# Patient Record
Sex: Male | Born: 1985 | Race: White | Hispanic: No | Marital: Single | Smoking: Former smoker
Health system: Southern US, Community
[De-identification: ages and names within clinical notes are randomized; demographics above are authoritative.]

## PROBLEM LIST (undated history)

## (undated) DIAGNOSIS — B20 Human immunodeficiency virus [HIV] disease: Secondary | ICD-10-CM

## (undated) DIAGNOSIS — I10 Essential (primary) hypertension: Secondary | ICD-10-CM

## (undated) HISTORY — DX: Human immunodeficiency virus (HIV) disease: B20

## (undated) HISTORY — DX: Essential (primary) hypertension: I10

## (undated) HISTORY — PX: APPENDECTOMY: SHX54

---

## 2014-09-12 ENCOUNTER — Encounter: Payer: Self-pay | Admitting: Internal Medicine

## 2014-09-12 ENCOUNTER — Ambulatory Visit (INDEPENDENT_AMBULATORY_CARE_PROVIDER_SITE_OTHER): Payer: 59 | Admitting: Internal Medicine

## 2014-09-12 VITALS — BP 150/100 | HR 104 | Temp 97.7°F | Resp 16 | Ht 65.0 in | Wt 192.8 lb

## 2014-09-12 DIAGNOSIS — B20 Human immunodeficiency virus [HIV] disease: Secondary | ICD-10-CM

## 2014-09-12 DIAGNOSIS — E669 Obesity, unspecified: Secondary | ICD-10-CM

## 2014-09-12 DIAGNOSIS — Z21 Asymptomatic human immunodeficiency virus [HIV] infection status: Secondary | ICD-10-CM | POA: Insufficient documentation

## 2014-09-12 DIAGNOSIS — I10 Essential (primary) hypertension: Secondary | ICD-10-CM | POA: Insufficient documentation

## 2014-09-12 DIAGNOSIS — E663 Overweight: Secondary | ICD-10-CM

## 2014-09-12 MED ORDER — TRAZODONE HCL 50 MG PO TABS: 25.0000 mg | ORAL_TABLET | Freq: Every evening | ORAL | Status: DC | PRN

## 2014-09-12 MED ORDER — METOPROLOL SUCCINATE ER 50 MG PO TB24: 50.0000 mg | ORAL_TABLET | Freq: Every day | ORAL | Status: DC

## 2014-09-12 MED ORDER — ELVITEG-COBIC-EMTRICIT-TENOFDF 150-150-200-300 MG PO TABS
1.0000 | ORAL_TABLET | Freq: Every day | ORAL | Status: DC
Start: 2014-09-12 — End: 2014-11-21

## 2014-09-12 MED ORDER — ELVITEG-COBIC-EMTRICIT-TENOFDF 150-150-200-300 MG PO TABS: 1.0000 | ORAL_TABLET | Freq: Every day | ORAL | Status: DC

## 2014-09-12 NOTE — Assessment & Plan Note (Signed)
Not well controlled on losartan/hytc.  Given his tachycardia,  Will try metoprolol  Starting at 50 mg daily.

## 2014-09-12 NOTE — Assessment & Plan Note (Signed)
I have addressed  BMI and recommended a low glycemic index diet utilizing smaller more frequent meals to increase metabolism.  I have also recommended that patient start exercising with a goal of 30 minutes of aerobic exercise a minimum of 5 days per week. Screening for lipid disorders, thyroid and diabetes to be done   

## 2014-09-12 NOTE — Assessment & Plan Note (Signed)
Managed with Stribild.  [place] [facility] [facility] [place] and needs ID followup.  .  CD4, VL, RPR PSA. CBC and CMET ordered. ID referral pending.  Immunizations reviewed and up to date.

## 2014-09-12 NOTE — Patient Instructions (Addendum)
For the insomnia,   We are going to start trazodone ,    Start with 25 mg one hour befroe bedtime.  Limit alcohol to pre and during dinner ,  None afterward.   Changing BP med to  Toprol   Once daily starting  at 50 mg   This is  my version of a  "Low GI"  Diet:  It will still lower your blood sugars and allow you to lose 4 to 8  lbs  per month if you follow it carefully.  Your goal with exercise is a minimum of 30 minutes of aerobic exercise 5 days per week (Walking does not count once it becomes easy!)     All of the foods can be found at grocery stores and in bulk at [employer].  The Atkins protein bars and shakes are available in more varieties at Target, WalMart and Lowe's Foods.     7 AM Breakfast:  Choose from the following:  Low carbohydrate Protein  Shakes (I recommend the EAS AdvantEdge "Carb Control" shakes ,  the low carb shakes by Atkins, or Premier Protein shakes,  All at [employer]   Arnold's "Sandwhich Thin"toasted  w/ peanut butter (no jelly: about 20 net carbs  "Bagel Thin" with cream cheese and salmon: about 20 carbs   a scrambled egg/bacon/cheese burrito made with Mission's "carb balance" whole wheat tortilla  (about 10 net carbs )  A slice of home made fritatta (egg based dish without a crust:  google it)    Avoid cereal and bananas, oatmeal and cream of wheat and grits. They are loaded with carbohydrates!   10 AM: high protein snack  Protein bar by Atkins (the snack size, under 200 cal, usually < 6 net carbs).    A stick of cheese:  Around 1 carb,  100 cal     Dannon Light n Fit AustriaGreek Yogurt  (80 cal, 8 carbs)  Other so called "protein bars" and Greek yogurts tend to be loaded with carbohydrates.  Remember, in food advertising, the word "energy" is synonymous for " carbohydrate."  Lunch:   A Sandwich using the bread choices listed, Can use any  Eggs,  lunchmeat, grilled meat or canned tuna), avocado, regular mayo/mustard  and cheese.  A Salad using blue cheese,  ranch,  Goddess or vinagrette,  No croutons or "confetti" and no "candied nuts" but regular nuts OK.   No pretzels or chips.  Pickles and miniature sweet peppers are a good low carb alternative that provide a "crunch"  The bread is the only source of carbohydrate in a sandwich and  can be decreased by trying some of these alternatives to traditional loaf bread  Joseph's makes a pita bread and a flat bread that are 50 cal and 4 net carbs available at BJs and WalMart.  This can be toasted to use with hummous as well  Toufayan makes a low carb flatbread that's 100 cal and 9 net carbs available at [employer] and [employer]  Mission makes 2 sizes of  Low carb whole wheat tortilla  (The large one is 210 cal and 6 net carbs)  Flat Out makes flatbreads that are low carb as well  Avoid "Low fat dressings, as well as Reyne DumasCatalina and [address] dressings They are loaded with sugar!   3 PM/ Mid day  Snack:  Consider  1 ounce of  almonds, walnuts, pistachios, pecans, peanuts,  Macadamia nuts or a nut medley.  Avoid "granola";  the dried cranberries and raisins are loaded with carbohydrates. Mixed nuts as long as there are no raisins,  cranberries or dried fruit.    Try the prosciutto/mozzarella cheese sticks by Fiorruci  In deli /backery section   High protein   To avoid overindulging in snacks: Try drinking a glass of unsweeted almond/coconut milk  Or a cup of coffee with your Atkins chocolate bar to keep you from having 3!!!   Pork rinds!  Yes Pork Rinds        6 PM  Dinner:     Meat/fowl/fish with a green salad, and either broccoli, cauliflower, green beans, spinach, brussel sprouts or  Lima beans. DO NOT BREAD THE PROTEIN!!      There is a low carb pasta by Dreamfield's that is acceptable and tastes great: only 5 digestible carbs/serving.( All grocery stores but BJs carry it )  Try Kai LevinsMichel Angelo's chicken piccata or chicken or eggplant parm over low carb pasta.(Lowes and BJs)   Clifton CustardAaron Sanchez's "Carnitas"  (pulled pork, no sauce,  0 carbs) or his beef pot roast to make a dinner burrito (at BJ's)  Pesto over low carb pasta (bj's sells a good quality pesto in the center refrigerated section of the deli   Try satueeing  Roosvelt HarpsBok Choy with mushroooms  Whole wheat pasta is still full of digestible carbs and  Not as low in glycemic index as Dreamfield's.   Brown rice is still rice,  So skip the rice and noodles if you eat Congohinese or New Zealandhai (or at least limit to 1/2 cup)  9 PM snack :   Breyer's "low carb" fudgsicle or  ice cream bar (Carb Smart line), or  Weight Watcher's ice cream bar , or another "no sugar added" ice cream;  a serving of fresh berries/cherries with whipped cream   Cheese or DANNON'S LlGHT N FIT GREEK YOGURT or the Oikos greek yogurt   8 ounces of Blue Diamond unsweetened almond/cococunut milk  Cheese and crackers (using WASA crackers,  They are low carb) or peanut butter on low carb crackers or pita bread     Avoid bananas, pineapple, grapes  and watermelon on a regular basis because they are high in sugar.  THINK OF THEM AS DESSERT  Remember that snack Substitutions should be less than 10 NET carbs per serving and meals should be < 25 net carbs. Remember that carbohydrates from fiber do not affect blood sugar, so you can  subtract fiber grams to get the "net carbs " of any particular food item.

## 2014-09-12 NOTE — Progress Notes (Signed)
Patient ID: Manuel Russell, male   DOB: 07-08-1986, 28 y.o.   MRN: [id number]   Patient Active Problem List   Diagnosis Date Noted  . Asymptomatic HIV infection 09/12/2014  . Essential hypertension 09/12/2014  . Obesity 09/12/2014    Subjective:  CC:   Chief Complaint  Patient presents with  . Establish Care    HPI:   Manuel Rudean HaskellChadwick Gentleis a 28 y.o. male who presents for establishment of primary care.  1) Asymptomatic HIV diagnosed in 2012, was notified by the Health Department, presumed to be from a prior homosexual contact.  Has not been sexualy active since.  Initial therapy was Complara, which he tolerated except for significant weight gain, was changed to Stribild 2 years ago.  Las VL was reportedly zero in May and  CD4 was 400 or 500  ( per patient).  No history of opportunistic infections,  Syphils, etc.   2) HTN:  Diagnosed in 2012.  Not controlled  Currently by home checks.  Prior trial of amlodipine , said it didn't work at the 5 or 10 mg dose.   3) Weight gainL ha sgained 50 lbs since age 28.  Not physically active.  Wants to lose weight.   No history of hospitlalizations      Past Medical History  Diagnosis Date  . Hypertension   . HIV infection    No Known Allergies    History reviewed. No pertinent past surgical history.  History   Social History  . Marital Status: Single    Spouse Name: N/A    Number of Children: N/A  . Years of Education: N/A   Occupational History  . 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 Smoking status: Former Smoker    Quit date: 09/02/2012  . Smokeless tobacco: Never Used  . Alcohol Use: 8.4 oz/week    0 Not specified, 14 Glasses of wine per week  . Drug Use: No  . Sexual Activity: No   Other Topics Concern  . Not on file   Social History Narrative   Family History  Problem Relation Age of Onset  . Hypertension Father   . Hyperlipidemia Father   . Heart disease Father   . Hyperlipidemia  Sister       Review of Systems:   The rest of the review of systems was negative except those addressed in the HPI.      Objective:  BP 150/100 mmHg  Pulse 104  Temp(Src) 97.7 F (36.5 C) (Oral)  Resp 16  Ht 5\' 5"  (1.651 m)  Wt 192 lb 12 oz (87.431 kg)  BMI 32.08 kg/m2  SpO2 96%  General appearance: alert, cooperative and appears stated age Ears: normal TM's and external ear canals both ears Throat: lips, mucosa, and tongue normal; teeth and gums normal Neck: no adenopathy, no carotid bruit, supple, symmetrical, trachea midline and thyroid not enlarged, symmetric, no tenderness/mass/nodules Back: symmetric, no curvature. ROM normal. No CVA tenderness. Lungs: clear to auscultation bilaterally Heart: regular rate and rhythm, S1, S2 normal, no murmur, click, rub or gallop Abdomen: soft, non-tender; bowel sounds normal; no masses,  no organomegaly Pulses: 2+ and symmetric Skin: Skin color, texture, turgor normal. No rashes or lesions Lymph nodes: Cervical, supraclavicular, and axillary nodes normal.  Assessment and Plan:  Asymptomatic HIV infection Managed with Stribild.  [place] [facility] [place] and needs ID followup.  .  CD4, VL, RPR PSA. CBC and CMET ordered. ID referral pending.  Immunizations reviewed and up to date.   Essential hypertension Not well controlled on losartan/hytc.  Given his tachycardia,  Will try metoprolol  Starting at 50 mg daily.   Obesity I have addressed  BMI and recommended a low glycemic index diet utilizing smaller more frequent meals to increase metabolism.  I have also recommended that patient start exercising with a goal of 30 minutes of aerobic exercise a minimum of 5 days per week. Screening for lipid disorders, thyroid and diabetes to be done     Updated Medication List Outpatient Encounter Prescriptions as of 09/12/2014  Medication Sig  . elvitegravir-cobicistat-emtricitabine-tenofovir (STRIBILD) 150-150-200-300 MG  TABS tablet Take 1 tablet by mouth daily with breakfast.  . lisinopril-hydrochlorothiazide (PRINZIDE,ZESTORETIC) 20-25 MG per tablet Take 1 tablet by mouth daily.  . metoprolol succinate (TOPROL-XL) 50 MG 24 hr tablet Take 1 tablet (50 mg total) by mouth daily. Take with or immediately following a meal.  . traZODone (DESYREL) 50 MG tablet Take 0.5-1 tablets (25-50 mg total) by mouth at bedtime as needed for sleep.  . [DISCONTINUED] elvitegravir-cobicistat-emtricitabine-tenofovir (STRIBILD) 150-150-200-300 MG TABS tablet Take 1 tablet by mouth daily with breakfast.  . [DISCONTINUED] elvitegravir-cobicistat-emtricitabine-tenofovir (STRIBILD) 150-150-200-300 MG TABS tablet Take 1 tablet by mouth daily with breakfast.     Orders Placed This Encounter  Procedures  . Comprehensive metabolic panel  . Hemoglobin A1c  . Lipid panel  . Microalbumin / creatinine urine ratio  . CBC with Differential  . TSH  . T-helper cells (CD4) count  . HIV-1 RNA ultraquant reflex to gentyp+  . RPR  . PSA  . Ambulatory referral to Infectious Disease    No Follow-up on file.

## 2014-09-15 ENCOUNTER — Observation Stay: Payer: Self-pay | Admitting: Surgery

## 2014-09-15 LAB — URINALYSIS, COMPLETE
Bacteria: NONE SEEN
Bilirubin,UR: NEGATIVE
Blood: NEGATIVE
Glucose,UR: NEGATIVE mg/dL (ref 0–75)
Leukocyte Esterase: NEGATIVE
Nitrite: NEGATIVE
PROTEIN: NEGATIVE
Ph: 5 (ref 4.5–8.0)
RBC,UR: 1 /HPF (ref 0–5)
Specific Gravity: 1.028 (ref 1.003–1.030)
Squamous Epithelial: NONE SEEN
WBC UR: 1 /HPF (ref 0–5)

## 2014-09-15 LAB — COMPREHENSIVE METABOLIC PANEL
ALT: 56 U/L
ANION GAP: 9 (ref 7–16)
Albumin: 3.7 g/dL (ref 3.4–5.0)
Alkaline Phosphatase: 110 U/L
BUN: 18 mg/dL (ref 7–18)
Bilirubin,Total: 0.7 mg/dL (ref 0.2–1.0)
CHLORIDE: 100 mmol/L (ref 98–107)
Calcium, Total: 8.3 mg/dL — ABNORMAL LOW (ref 8.5–10.1)
Co2: 26 mmol/L (ref 21–32)
Creatinine: 1.24 mg/dL (ref 0.60–1.30)
EGFR (African American): >60
EGFR (Non-African Amer.): >60
GLUCOSE: 92 mg/dL (ref 65–99)
Osmolality: 272 (ref 275–301)
POTASSIUM: 3.6 mmol/L (ref 3.5–5.1)
SGOT(AST): 28 U/L (ref 15–37)
SODIUM: 135 mmol/L — AB (ref 136–145)
TOTAL PROTEIN: 7.9 g/dL (ref 6.4–8.2)

## 2014-09-15 LAB — CBC
HCT: 44.1 % (ref 40.0–52.0)
HGB: 15.1 g/dL (ref 13.0–18.0)
MCH: 32.3 pg (ref 26.0–34.0)
MCHC: 34.4 g/dL (ref 32.0–36.0)
MCV: 94 fL (ref 80–100)
Platelet: 176 10*3/uL (ref 150–440)
RBC: 4.69 10*6/uL (ref 4.40–5.90)
RDW: 12.3 % (ref 11.5–14.5)
WBC: 10.1 10*3/uL (ref 3.8–10.6)

## 2014-09-15 LAB — LIPASE, BLOOD: Lipase: 52 U/L — ABNORMAL LOW (ref 73–393)

## 2014-09-18 ENCOUNTER — Telehealth: Payer: Self-pay | Admitting: Internal Medicine

## 2014-09-18 NOTE — Telephone Encounter (Signed)
emmi emailed °

## 2014-09-19 ENCOUNTER — Other Ambulatory Visit: Payer: 59

## 2014-09-20 ENCOUNTER — Other Ambulatory Visit (INDEPENDENT_AMBULATORY_CARE_PROVIDER_SITE_OTHER): Payer: 59

## 2014-09-20 DIAGNOSIS — I10 Essential (primary) hypertension: Secondary | ICD-10-CM

## 2014-09-20 DIAGNOSIS — Z21 Asymptomatic human immunodeficiency virus [HIV] infection status: Secondary | ICD-10-CM

## 2014-09-20 DIAGNOSIS — E663 Overweight: Secondary | ICD-10-CM

## 2014-09-20 LAB — LIPID PANEL
CHOL/HDL RATIO: 5
Cholesterol: 192 mg/dL (ref 0–200)
HDL: 37.3 mg/dL — AB (ref >39.00)
NONHDL: 154.7
TRIGLYCERIDES: 274 mg/dL — AB (ref 0.0–149.0)
VLDL: 54.8 mg/dL — AB (ref 0.0–40.0)

## 2014-09-20 LAB — CBC WITH DIFFERENTIAL/PLATELET
BASOS ABS: 0 10*3/uL (ref 0.0–0.1)
Basophils Relative: 0.5 % (ref 0.0–3.0)
EOS ABS: 0.1 10*3/uL (ref 0.0–0.7)
EOS PCT: 2.2 % (ref 0.0–5.0)
HEMATOCRIT: 41.8 % (ref 39.0–52.0)
Hemoglobin: 14.1 g/dL (ref 13.0–17.0)
LYMPHS ABS: 1.7 10*3/uL (ref 0.7–4.0)
Lymphocytes Relative: 32.3 % (ref 12.0–46.0)
MCHC: 33.7 g/dL (ref 30.0–36.0)
MCV: 93 fl (ref 78.0–100.0)
Monocytes Absolute: 0.5 10*3/uL (ref 0.1–1.0)
Monocytes Relative: 9.6 % (ref 3.0–12.0)
Neutro Abs: 3 10*3/uL (ref 1.4–7.7)
Neutrophils Relative %: 55.4 % (ref 43.0–77.0)
PLATELETS: 227 10*3/uL (ref 150.0–400.0)
RBC: 4.5 Mil/uL (ref 4.22–5.81)
RDW: 12.4 % (ref 11.5–15.5)
WBC: 5.4 10*3/uL (ref 4.0–10.5)

## 2014-09-20 LAB — MICROALBUMIN / CREATININE URINE RATIO
Creatinine,U: 84.7 mg/dL
Microalb Creat Ratio: 0.2 mg/g (ref 0.0–30.0)
Microalb, Ur: 0.2 mg/dL (ref 0.0–1.9)

## 2014-09-20 LAB — COMPREHENSIVE METABOLIC PANEL
ALT: 64 U/L — AB (ref 0–53)
AST: 33 U/L (ref 0–37)
Albumin: 3.7 g/dL (ref 3.5–5.2)
Alkaline Phosphatase: 82 U/L (ref 39–117)
BILIRUBIN TOTAL: 0.5 mg/dL (ref 0.2–1.2)
BUN: 24 mg/dL — ABNORMAL HIGH (ref 6–23)
CO2: 25 mEq/L (ref 19–32)
Calcium: 8.9 mg/dL (ref 8.4–10.5)
Chloride: 106 mEq/L (ref 96–112)
Creatinine, Ser: 1.1 mg/dL (ref 0.4–1.5)
GFR: 82.25 mL/min (ref >60.00)
Glucose, Bld: 108 mg/dL — ABNORMAL HIGH (ref 70–99)
Potassium: 4 mEq/L (ref 3.5–5.1)
SODIUM: 138 meq/L (ref 135–145)
Total Protein: 6.7 g/dL (ref 6.0–8.3)

## 2014-09-20 LAB — HEMOGLOBIN A1C: Hgb A1c MFr Bld: 5.4 % (ref 4.6–6.5)

## 2014-09-20 LAB — TSH: TSH: 2.97 u[IU]/mL (ref 0.35–4.50)

## 2014-09-20 LAB — PSA: PSA: 0.58 ng/mL (ref 0.10–4.00)

## 2014-09-20 LAB — LDL CHOLESTEROL, DIRECT: LDL DIRECT: 126.3 mg/dL

## 2014-09-21 LAB — RPR

## 2014-09-23 ENCOUNTER — Other Ambulatory Visit (INDEPENDENT_AMBULATORY_CARE_PROVIDER_SITE_OTHER): Payer: 59

## 2014-09-23 DIAGNOSIS — Z21 Asymptomatic human immunodeficiency virus [HIV] infection status: Secondary | ICD-10-CM

## 2014-09-24 LAB — HIV-1 RNA ULTRAQUANT REFLEX TO GENTYP+

## 2014-09-24 LAB — T-HELPER CELLS (CD4) COUNT (NOT AT ARMC)
Absolute CD4: 508 /uL (ref 381–1469)
CD4 T Helper %: 22 % — ABNORMAL LOW (ref 32–62)
TOTAL LYMPHOCYTE COUNT: 2310 /uL (ref 700–3300)
Total Lymphocyte: 33 % (ref 12–46)
WBC, lymph enumeration: 7 10*3/uL (ref 4.0–10.5)

## 2014-10-14 ENCOUNTER — Telehealth: Payer: Self-pay

## 2014-10-14 NOTE — Telephone Encounter (Signed)
Message left for Dr Melina Schoolsullo's nurse:  Why was viral load with genotype cancelled?  Will patient be returning for re- draw. This lab is needed prior to office visit with physician. Please return call.    Laurell Josephsammy K King, RN

## 2014-11-05 ENCOUNTER — Ambulatory Visit: Payer: 59 | Admitting: Infectious Diseases

## 2014-11-05 ENCOUNTER — Encounter: Payer: Self-pay | Admitting: *Deleted

## 2014-11-21 ENCOUNTER — Ambulatory Visit (INDEPENDENT_AMBULATORY_CARE_PROVIDER_SITE_OTHER): Payer: Commercial Managed Care - PPO | Admitting: Infectious Disease

## 2014-11-21 ENCOUNTER — Encounter: Payer: Self-pay | Admitting: Infectious Disease

## 2014-11-21 VITALS — BP 149/92 | HR 77 | Temp 98.0°F | Ht 64.0 in | Wt 195.0 lb

## 2014-11-21 DIAGNOSIS — B20 Human immunodeficiency virus [HIV] disease: Secondary | ICD-10-CM

## 2014-11-21 DIAGNOSIS — I10 Essential (primary) hypertension: Secondary | ICD-10-CM

## 2014-11-21 DIAGNOSIS — E669 Obesity, unspecified: Secondary | ICD-10-CM

## 2014-11-21 DIAGNOSIS — G4700 Insomnia, unspecified: Secondary | ICD-10-CM

## 2014-11-21 MED ORDER — ELVITEG-COBIC-EMTRICIT-TENOFDF 150-150-200-300 MG PO TABS: 1.0000 | ORAL_TABLET | Freq: Every day | ORAL | Status: DC

## 2014-11-21 NOTE — Progress Notes (Signed)
   Subjective:    Patient ID: Manuel Russell, male    DOB: December 17, 1985, 28 y.o.   MRN: [id number]  HPI  29 year old diagnosed with HIV in 2012. He was contacted by DIS officer to come get tested. He had noticed onset of drenching night sweats 2 months earlier. CD4 nadir was 230. He was started on Complera for one year due to his gaining weight taking an extra meal with his Complera. He has comorbid hypertension, insomnia and hyperlipidemia.   Review of Systems  Constitutional: Negative for fever, chills, diaphoresis, activity change, appetite change, fatigue and unexpected weight change.  HENT: Negative for congestion, rhinorrhea, sinus pressure, sneezing, sore throat and trouble swallowing.   Eyes: Negative for photophobia and visual disturbance.  Respiratory: Negative for cough, chest tightness, shortness of breath, wheezing and stridor.   Cardiovascular: Negative for chest pain, palpitations and leg swelling.  Gastrointestinal: Negative for nausea, vomiting, abdominal pain, diarrhea, constipation, blood in stool, abdominal distention and anal bleeding.  Genitourinary: Negative for dysuria, hematuria, flank pain and difficulty urinating.  Musculoskeletal: Negative for myalgias, back pain, joint swelling, arthralgias and gait problem.  Skin: Negative for color change, pallor, rash and wound.  Neurological: Negative for dizziness, tremors, weakness and light-headedness.  Hematological: Negative for adenopathy. Does not bruise/bleed easily.  Psychiatric/Behavioral: Negative for behavioral problems, confusion, sleep disturbance, dysphoric mood, decreased concentration and agitation.       Objective:   Physical Exam  Constitutional: He is oriented to person, place, and time. He appears well-developed and well-nourished.  HENT:  Head: Normocephalic and atraumatic.  Eyes: Conjunctivae and EOM are normal.  Neck: Normal range of motion. Neck supple.  Cardiovascular: Normal rate and  regular rhythm.   Pulmonary/Chest: Effort normal. No respiratory distress. He has no wheezes.  Abdominal: Soft. He exhibits no distension.  Musculoskeletal: Normal range of motion.  Neurological: He is alert and oriented to person, place, and time.  Skin: Skin is warm and dry. No rash noted. No erythema. No pallor.  Psychiatric: He has a normal mood and affect. His behavior is normal. Thought content normal.          Assessment & Plan:   HIV disease: he has had perfect control int he past. The VL done in [place] was cancelled, will repeat and otherwise [facility]. I spent greater than 45 minutes with the patient including greater than 50% of time in face to face counsel of the patient and in coordination of their care.    HTN: managed by Dr. Darrick Huntsmanullo  Insomnia: continue atypical antidepressant  Obesity: can work with PCP on this as well

## 2014-11-22 LAB — HEPATITIS A ANTIBODY, TOTAL: Hep A Total Ab: REACTIVE — AB

## 2014-11-22 LAB — HEPATITIS B SURFACE ANTIBODY,QUALITATIVE: HEP B S AB: NEGATIVE

## 2014-11-25 LAB — HIV-1 RNA ULTRAQUANT REFLEX TO GENTYP+
HIV 1 RNA Quant: <20 copies/mL (ref <20)
HIV-1 RNA Quant, Log: <1.3 {Log} (ref <1.30)

## 2014-12-09 ENCOUNTER — Other Ambulatory Visit: Payer: Self-pay | Admitting: Infectious Diseases

## 2014-12-09 DIAGNOSIS — B20 Human immunodeficiency virus [HIV] disease: Secondary | ICD-10-CM

## 2014-12-23 ENCOUNTER — Other Ambulatory Visit: Payer: Self-pay | Admitting: *Deleted

## 2014-12-23 DIAGNOSIS — B20 Human immunodeficiency virus [HIV] disease: Secondary | ICD-10-CM

## 2014-12-23 MED ORDER — ELVITEG-COBIC-EMTRICIT-TENOFDF 150-150-200-300 MG PO TABS: 1.0000 | ORAL_TABLET | Freq: Every day | ORAL | Status: DC

## 2014-12-28 ENCOUNTER — Other Ambulatory Visit: Payer: Self-pay | Admitting: Infectious Disease

## 2014-12-28 DIAGNOSIS — B20 Human immunodeficiency virus [HIV] disease: Secondary | ICD-10-CM

## 2015-02-19 ENCOUNTER — Other Ambulatory Visit: Payer: Self-pay | Admitting: Internal Medicine

## 2015-02-19 NOTE — Telephone Encounter (Signed)
Left message for pt to return my call. Not on pt med list

## 2015-02-22 NOTE — H&P (Signed)
History of Present Illness 29 yom who was awoken at 4 AM this AM with RLQ pain. He has been nauseous and anorexic all day, but he has not vomited. His last meal was soup, ~ noon. No BM today. No fever.   Past History HIV positive   Past Med/Surgical Hx:  HIV:   HTN:   ALLERGIES:  No Known Allergies:   Family and Social History:  Family History Non-Contributory   Social History negative tobacco, positive ETOH, lives with male roommate (not his partner), homosexual, works as a Occupational psychologist, drinks two ETOH beverages (usually liquor) every day   Place of Living Home   Review of Systems:  Fever/Chills No   Cough No   Sputum No   Abdominal Pain Yes   Diarrhea No   Constipation No   Nausea/Vomiting Yes   SOB/DOE No   Chest Pain No   Dysuria No   Tolerating PT Yes   Tolerating Diet Yes  Nauseated   Medications/Allergies Reviewed Medications/Allergies reviewed   Physical Exam:  GEN well developed, well nourished, no acute distress   HEENT pink conjunctivae, PERRL, hearing intact to voice, moist oral mucosa, Oropharynx clear   NECK supple  trachea midline   RESP normal resp effort  clear BS  no use of accessory muscles   CARD regular rate  no murmur  no JVD  no Rub   ABD denies tenderness  soft, flat, c/o tenderness in RLQ but no rebound and no guarding   EXTR negative cyanosis/clubbing, negative edema   SKIN normal to palpation, No rashes, No ulcers, skin turgor good   NEURO cranial nerves intact, negative tremor, follows commands, motor/sensory function intact   PSYCH alert, A+O to time, place, person, good insight   Lab Results: Hepatic:  15-Nov-15 15:09   Bilirubin, Total 0.7  Alkaline Phosphatase 110 (46-116 NOTE: New Reference Range 05/21/14)  SGPT (ALT) 56 (14-63 NOTE: New Reference Range 05/21/14)  SGOT (AST) 28  Total Protein, Serum 7.9  Albumin, Serum 3.7  Routine Chem:  15-Nov-15 15:09   Glucose, Serum 92  BUN 18  Creatinine  (comp) 1.24  Sodium, Serum  135  Potassium, Serum 3.6  Chloride, Serum 100  CO2, Serum 26  Calcium (Total), Serum  8.3  Osmolality (calc) 272  eGFR (African American) >60  eGFR (Non-African American) >60 (eGFR values <66m/min/1.73 m2 may be an indication of chronic kidney disease (CKD). Calculated eGFR, using the MRDR Study equation, is useful in  patients with stable renal function. The eGFR calculation will not be reliable in acutely ill patients when serum creatinine is changing rapidly. It is not useful in patients on dialysis. The eGFR calculation may not be applicable to patients at the low and high extremes of body sizes, pregnant women, and vegetarians.)  Anion Gap 9  Lipase  52 (Result(s) reported on 15 Sep 2014 at 03:37PM.)  Routine UA:  15-Nov-15 15:10   Color (UA) Yellow  Clarity (UA) Clear  Glucose (UA) Negative  Bilirubin (UA) Negative  Ketones (UA) 1+  Specific Gravity (UA) 1.028  Blood (UA) Negative  pH (UA) 5.0  Protein (UA) Negative  Nitrite (UA) Negative  Leukocyte Esterase (UA) Negative (Result(s) reported on 15 Sep 2014 at 04:21PM.)  RBC (UA) 1 /HPF  WBC (UA) 1 /HPF  Bacteria (UA) NONE SEEN  Epithelial Cells (UA) NONE SEEN  Result(s) reported on 15 Sep 2014 at 04:21PM.  Routine Hem:  15-Nov-15 15:09   WBC (CBC) 10.1  RBC (CBC)  4.69  Hemoglobin (CBC) 15.1  Hematocrit (CBC) 44.1  Platelet Count (CBC) 176 (Result(s) reported on 15 Sep 2014 at 03:28PM.)  MCV 94  MCH 32.3  MCHC 34.4  RDW 12.3   Radiology Results: LabUnknown:    15-Nov-15 15:47, CT Abdomen and Pelvis With Contrast  PACS Image  CT:  CT Abdomen and Pelvis With Contrast  REASON FOR EXAM:    (1) RLQ pain, fever; (2) RLQ pain, fever  COMMENTS:       PROCEDURE: CT  - CT ABDOMEN / PELVIS  W  - Sep 15 2014  3:47PM     CLINICAL DATA:  29 year old male with right lower quadrant pain,  fever, nausea    EXAM:  CT ABDOMEN AND PELVIS WITH CONTRAST    TECHNIQUE:  Multidetector CT  imaging of the abdomen and pelvis was performed  using the standard protocol following bolus administration of  intravenous contrast.  CONTRAST:  100 mL Isovue 300 administered intravenously    COMPARISON:  None.    FINDINGS:  Lower Chest: The lung bases are clear. Visualized cardiac structures  are within normal limits for size. No pericardial effusion.  Unremarkable visualized distal thoracic esophagus.    Abdomen: Unremarkable CT appearance of the stomach, duodenum,  spleen, adrenal glands and pancreas. Diffuse mild hypoattenuation of  the hepatic parenchyma consistent with steatosis. There is mild  sparing around the gallbladder fossa. No discrete hepatic lesion.  Gallbladder is unremarkable. No intra or extrahepatic biliary ductal  dilatation. Unremarkable appearance of the bilateral kidneys. No  focal solid lesion, hydronephrosis or nephrolithiasis. 7 mm hypo  attenuating lesion in the lower pole of the left kidney is too small  for accurate characterization but is statistically highly likely a  benign cyst.    Focal hyper enhancement of the appendiceal mucosa. The appendix is  borderline dilated at 8 mm. There is inflammatory stranding in the  periappendiceal fat. No free fluid or free air to suggest  perforation. No focal abscess. No evidence of obstruction.    Pelvis: Unremarkable bladder, prostate gland and seminal vesicles.  No free fluid or suspicious adenopathy.    Bones/Soft Tissues: No acute fracture or aggressive appearing lytic  or blastic osseous lesion.  Vascular: No significant atherosclerotic vascular disease,  aneurysmal dilatation or acute abnormality.     IMPRESSION:  1. CT findings are consistent with acute suppurative appendicitis  without evidence of abscess or perforation.  2. Hepatic steatosis.  These results were called by telephone at the time of interpretation  on 09/15/2014 at 4:43 pm to Dr. Nance Pear , who verbally  acknowledged these  results.      Electronically Signed    By: Jacqulynn Cadet M.D.    On: 09/15/2014 16:45     Verified By: Criselda Peaches, M.D.,    Assessment/Admission Diagnosis Early acute appendicitis   Plan IV ABx and lap appy Pt understands that his surgery will be done by my partner, and agrees.   Electronic Signatures: Consuela Mimes (MD)  (Signed [phone] 17:22)  Authored: CHIEF COMPLAINT and HISTORY, PAST MEDICAL/SURGIAL HISTORY, ALLERGIES, FAMILY AND SOCIAL HISTORY, REVIEW OF SYSTEMS, PHYSICAL EXAM, LABS, Radiology, ASSESSMENT AND PLAN   Last Updated: 15-Nov-15 17:22 by Consuela Mimes (MD)

## 2015-02-22 NOTE — Op Note (Signed)
PATIENT NAME:  Manuel Russell, Manuel Russell MR#:  [id number] DATE OF BIRTH:  June 25, 1986  DATE OF PROCEDURE:  09/15/2014  PREOPERATIVE DIAGNOSIS:  Acute appendicitis.   POSTOPERATIVE DIAGNOSIS:  Acute appendicitis.   PROCEDURE PERFORMED:  Laparoscopic appendectomy.   SURGEON:  Zayyan Mullen A. Silvana Holecek, MD  ANESTHESIA:  General.   ESTIMATED BLOOD LOSS:  25 mL.   COMPLICATIONS:  None.   SPECIMEN:  Appendix.   INDICATION FOR SURGERY:  Manuel Russell is a pleasant 29 year old who presents with one day of right lower quadrant pain, mild leukocytosis, and CT scan findings concerning for acute appendicitis. He was thus brought to the operating room for laparoscopic appendectomy.   DETAILS OF THE PROCEDURE AS FOLLOWS:  Informed consent was obtained. Manuel Russell was brought to the operating room suite. He was induced. Endotracheal tube was placed. General anesthesia was administered. His abdomen was prepped and draped in standard surgical fashion. A timeout was then performed, correctly identifying patient name, operative site, and procedure to be performed. A supraumbilical incision was made and deepened down to the fascia. The fascia was incised. The peritoneum was entered. Two stay sutures were placed through the fasciotomy. A Hassan trocar was placed in the abdomen, and the abdomen was insufflated. A left lower quadrant 5 mm and suprapubic 5 mm trocar were placed under direct visualization. The appendix was visualized. It was inflamed to the sidewall of the abdomen. The base of the appendix was found. A hole was made in the mesoappendix at the base of the appendix. The appendix was ligated at the base of the cecum with a laparoscopic stapler with a blue load. I then used a combination of cautery and staple to transect the mesoappendix. The appendix was then brought out through an Endo Catch bag. The mesoappendix was then made hemostatic. The abdomen was then irrigated after hemostasis was obtained. All trocars were  removed under direct visualization. A figure-of-eight 0 Vicryl was used to close the fascia. The supraumbilical fascia and the skin sites were closed with 4-0 Monocryl deep dermal suture. Steri-Strips, Telfa gauze and Tegaderm were used to complete the dressing. The patient was then awoken, extubated, and brought to the postanesthesia care unit. There were no immediate complications. Needle, sponge, and instrument counts were correct at the end of the procedure.    ____________________________ Si Raiderhristopher A. Anastasya Jewell, MD cal:nb D: 09/15/2014 22:04:13 ET T: 09/15/2014 22:56:34 ET JOB#: [id number]  cc: Cristal Deerhristopher A. Deepika Decatur, MD, <Dictator> Jarvis NewcomerHRISTOPHER A Chayla Shands MD ELECTRONICALLY SIGNED 10/07/2014 20:06

## 2015-02-24 LAB — SURGICAL PATHOLOGY

## 2015-02-24 NOTE — Telephone Encounter (Signed)
Have attempted to call pt again, no answer.

## 2015-03-10 ENCOUNTER — Other Ambulatory Visit: Payer: Commercial Managed Care - PPO

## 2015-03-13 ENCOUNTER — Other Ambulatory Visit: Payer: Commercial Managed Care - PPO

## 2015-03-13 DIAGNOSIS — B20 Human immunodeficiency virus [HIV] disease: Secondary | ICD-10-CM

## 2015-03-13 LAB — CBC WITH DIFFERENTIAL/PLATELET
Basophils Absolute: 0 10*3/uL (ref 0.0–0.1)
Basophils Relative: 0 % (ref 0–1)
EOS PCT: 2 % (ref 0–5)
Eosinophils Absolute: 0.1 10*3/uL (ref 0.0–0.7)
HEMATOCRIT: 46.4 % (ref 39.0–52.0)
Hemoglobin: 16 g/dL (ref 13.0–17.0)
LYMPHS PCT: 34 % (ref 12–46)
Lymphs Abs: 2.3 10*3/uL (ref 0.7–4.0)
MCH: 31.3 pg (ref 26.0–34.0)
MCHC: 34.5 g/dL (ref 30.0–36.0)
MCV: 90.6 fL (ref 78.0–100.0)
MPV: 10.2 fL (ref 8.6–12.4)
Monocytes Absolute: 0.7 10*3/uL (ref 0.1–1.0)
Monocytes Relative: 11 % (ref 3–12)
Neutro Abs: 3.6 10*3/uL (ref 1.7–7.7)
Neutrophils Relative %: 53 % (ref 43–77)
PLATELETS: 182 10*3/uL (ref 150–400)
RBC: 5.12 MIL/uL (ref 4.22–5.81)
RDW: 13.9 % (ref 11.5–15.5)
WBC: 6.8 10*3/uL (ref 4.0–10.5)

## 2015-03-13 LAB — COMPLETE METABOLIC PANEL WITH GFR
ALBUMIN: 3.9 g/dL (ref 3.5–5.2)
ALT: 49 U/L (ref 0–53)
AST: 24 U/L (ref 0–37)
Alkaline Phosphatase: 94 U/L (ref 39–117)
BUN: 14 mg/dL (ref 6–23)
CO2: 27 meq/L (ref 19–32)
CREATININE: 1.05 mg/dL (ref 0.50–1.35)
Calcium: 9.1 mg/dL (ref 8.4–10.5)
Chloride: 102 mEq/L (ref 96–112)
GFR, Est Non African American: >89 mL/min
Glucose, Bld: 94 mg/dL (ref 70–99)
POTASSIUM: 4.3 meq/L (ref 3.5–5.3)
Sodium: 138 mEq/L (ref 135–145)
Total Bilirubin: 0.5 mg/dL (ref 0.2–1.2)
Total Protein: 6.9 g/dL (ref 6.0–8.3)

## 2015-03-13 LAB — LIPID PANEL
Cholesterol: 210 mg/dL — ABNORMAL HIGH (ref 0–200)
HDL: 53 mg/dL (ref >=40)
LDL Cholesterol: 136 mg/dL — ABNORMAL HIGH (ref 0–99)
Total CHOL/HDL Ratio: 4 Ratio
Triglycerides: 107 mg/dL (ref <150)
VLDL: 21 mg/dL (ref 0–40)

## 2015-03-13 LAB — HEPATITIS C ANTIBODY: HCV AB: NEGATIVE

## 2015-03-14 LAB — T-HELPER CELL (CD4) - (RCID CLINIC ONLY)
CD4 % Helper T Cell: 24 % — ABNORMAL LOW (ref 33–55)
CD4 T CELL ABS: 550 /uL (ref 400–2700)

## 2015-03-14 LAB — RPR

## 2015-03-15 LAB — HIV-1 RNA QUANT-NO REFLEX-BLD
HIV 1 RNA Quant: 24 copies/mL — ABNORMAL HIGH (ref <20)
HIV-1 RNA QUANT, LOG: 1.38 {Log} — AB (ref <1.30)

## 2015-03-24 ENCOUNTER — Ambulatory Visit (INDEPENDENT_AMBULATORY_CARE_PROVIDER_SITE_OTHER): Payer: BLUE CROSS/BLUE SHIELD | Admitting: Infectious Disease

## 2015-03-24 ENCOUNTER — Encounter: Payer: Self-pay | Admitting: Infectious Disease

## 2015-03-24 DIAGNOSIS — B20 Human immunodeficiency virus [HIV] disease: Secondary | ICD-10-CM | POA: Diagnosis not present

## 2015-03-24 MED ORDER — ELVITEG-COBIC-EMTRICIT-TENOFAF 150-150-200-10 MG PO TABS: 1.0000 | ORAL_TABLET | Freq: Every day | ORAL | Status: DC

## 2015-03-24 NOTE — Progress Notes (Signed)
   Subjective:    Patient ID: Manuel Russell, male    DOB: [birth year], 29 y.o.   MRN: [id number]  HPI   29 year old diagnosed with HIV in 2012. He was contacted by DIS officer in [place] to come get tested. He had noticed onset of drenching night sweats 2 months earlier. CD4 nadir was 230. He was started on Complera for one year due to his gaining weight taking an extra meal with his Complera. He has comorbid hypertension, insomnia and hyperlipidemia. He is now on STRIBILD with perfect virological suppression. We reviewed the fact that STRIBILD needs to be taken with food and it appears he has not always been so doing and I reiterated that it must be taken with SOME food, just not the caloric requirement of 400 calories that Complera requires.   Review of Systems  Constitutional: Negative for fever, chills, diaphoresis, activity change, appetite change, fatigue and unexpected weight change.  HENT: Negative for congestion, rhinorrhea, sinus pressure, sneezing, sore throat and trouble swallowing.   Eyes: Negative for photophobia and visual disturbance.  Respiratory: Negative for cough, chest tightness, shortness of breath, wheezing and stridor.   Cardiovascular: Negative for chest pain, palpitations and leg swelling.  Gastrointestinal: Negative for nausea, vomiting, abdominal pain, diarrhea, constipation, blood in stool, abdominal distention and anal bleeding.  Genitourinary: Negative for dysuria, hematuria, flank pain and difficulty urinating.  Musculoskeletal: Negative for myalgias, back pain, joint swelling, arthralgias and gait problem.  Skin: Negative for color change, pallor, rash and wound.  Neurological: Negative for dizziness, tremors, weakness and light-headedness.  Hematological: Negative for adenopathy. Does not bruise/bleed easily.  Psychiatric/Behavioral: Negative for behavioral problems, confusion, sleep disturbance, dysphoric mood, decreased concentration and agitation.       Objective:   Physical Exam  Constitutional: He is oriented to person, place, and time. He appears well-developed and well-nourished.  HENT:  Head: Normocephalic and atraumatic.  Eyes: Conjunctivae and EOM are normal.  Neck: Normal range of motion. Neck supple.  Cardiovascular: Normal rate and regular rhythm.   Pulmonary/Chest: Effort normal. No respiratory distress. He has no wheezes.  Abdominal: Soft. He exhibits no distension.  Musculoskeletal: Normal range of motion.  Neurological: He is alert and oriented to person, place, and time.  Skin: Skin is warm and dry. No rash noted. No erythema. No pallor.  Psychiatric: He has a normal mood and affect. His behavior is normal. Thought content normal.          Assessment & Plan:   HIV disease: perfect control, change to "new STRIBILD" = GENVOYA with food for better bone and kidney safety  HTN: managed by Dr. Darrick Huntsmanullo, BP up today  Insomnia: continue atypical antidepressant  Obesity: can work with PCP on this as well

## 2015-04-04 ENCOUNTER — Telehealth: Payer: Self-pay | Admitting: Internal Medicine

## 2015-04-04 DIAGNOSIS — I10 Essential (primary) hypertension: Secondary | ICD-10-CM

## 2015-04-04 MED ORDER — LOSARTAN POTASSIUM-HCTZ 50-12.5 MG PO TABS: 1.0000 | ORAL_TABLET | Freq: Every day | ORAL | Status: DC

## 2015-04-04 NOTE — Telephone Encounter (Signed)
Message sent

## 2015-04-04 NOTE — Assessment & Plan Note (Signed)
Elevated persistently,  Adding losartan/hct to toprol .  Recheck in one week

## 2015-04-16 ENCOUNTER — Other Ambulatory Visit: Payer: Self-pay | Admitting: Internal Medicine

## 2015-04-16 DIAGNOSIS — B86 Scabies: Secondary | ICD-10-CM

## 2015-04-16 MED ORDER — IVERMECTIN 3 MG PO TABS: 200.0000 ug/kg | ORAL_TABLET | Freq: Once | ORAL | Status: DC

## 2015-04-29 ENCOUNTER — Other Ambulatory Visit: Payer: Self-pay | Admitting: Internal Medicine

## 2015-05-21 ENCOUNTER — Other Ambulatory Visit: Payer: Self-pay

## 2015-05-21 MED ORDER — LOSARTAN POTASSIUM-HCTZ 50-12.5 MG PO TABS: 1.0000 | ORAL_TABLET | Freq: Every day | ORAL | Status: DC

## 2015-05-21 NOTE — Telephone Encounter (Signed)
Patient had medication last refilled on 6/28. For a 30 day supply.  Can we refill for 90 days? Please advise?n

## 2015-05-21 NOTE — Telephone Encounter (Signed)
90 day supply authorized and sent   

## 2015-06-11 ENCOUNTER — Other Ambulatory Visit: Payer: Self-pay | Admitting: Licensed Clinical Social Worker

## 2015-06-11 ENCOUNTER — Encounter: Payer: Self-pay | Admitting: Infectious Disease

## 2015-06-11 MED ORDER — ELVITEG-COBIC-EMTRICIT-TENOFAF 150-150-200-10 MG PO TABS: 1.0000 | ORAL_TABLET | Freq: Every day | ORAL | Status: DC

## 2015-06-23 ENCOUNTER — Telehealth: Payer: Self-pay | Admitting: *Deleted

## 2015-06-23 DIAGNOSIS — B20 Human immunodeficiency virus [HIV] disease: Secondary | ICD-10-CM

## 2015-06-23 NOTE — Telephone Encounter (Signed)
Unbelievable that genvoya still not covered. Should let Jill Alexanders know from [place]. He should resume the [facility]

## 2015-06-23 NOTE — Telephone Encounter (Signed)
PA not approved for Genvoya.  MD please advise about what HIV rx to refill for the pt.

## 2015-06-24 MED ORDER — ELVITEG-COBIC-EMTRICIT-TENOFDF 150-150-200-300 MG PO TABS: 1.0000 | ORAL_TABLET | Freq: Every day | ORAL | Status: DC

## 2015-06-24 NOTE — Addendum Note (Signed)
Addended by: Jennet Maduro D on: 06/24/2015 02:57 PM   Modules accepted: Orders, Medications

## 2015-07-29 NOTE — Telephone Encounter (Signed)
Stribild rx approved - sent out on 07/11/15 by [facility]

## 2015-07-29 NOTE — Telephone Encounter (Signed)
If they will only cover stribild and not genvoya which is pretty amazing this late then he should be on stribild

## 2015-08-09 IMAGING — CT CT ABD-PELV W/ CM
2 of 4 series · 16 of 46 positions shown, 18 images · IV contrast (isovue)
Comparison: None.

CLINICAL DATA: 28-year-old male with right lower quadrant pain,
fever, nausea

EXAM:
CT ABDOMEN AND PELVIS WITH CONTRAST
TECHNIQUE: Multidetector CT imaging of the abdomen and pelvis was performed
using the standard protocol following bolus administration of
intravenous contrast.
CONTRAST:  100 mL Isovue 300 administered intravenously

[Series 2: routine abd pel with · axial · 0.70mm/px · z∈[-470,-46]mm · 13 of 95 slices shown, 15 images]
[im 5/95  soft-tissue]
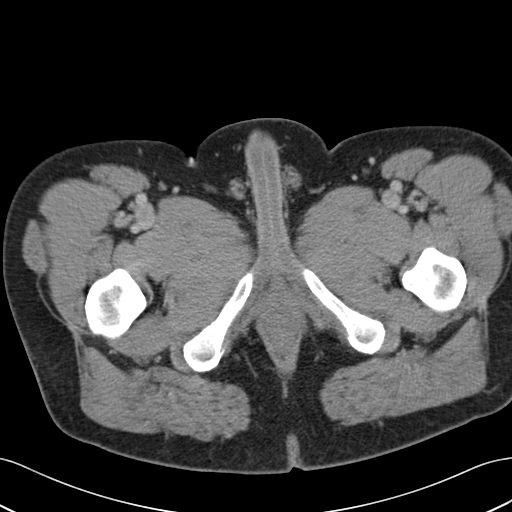
[im 5/95  bone]
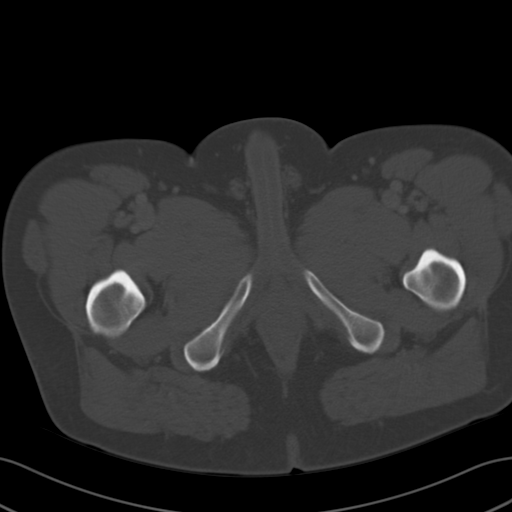
[im 13/95  soft-tissue]
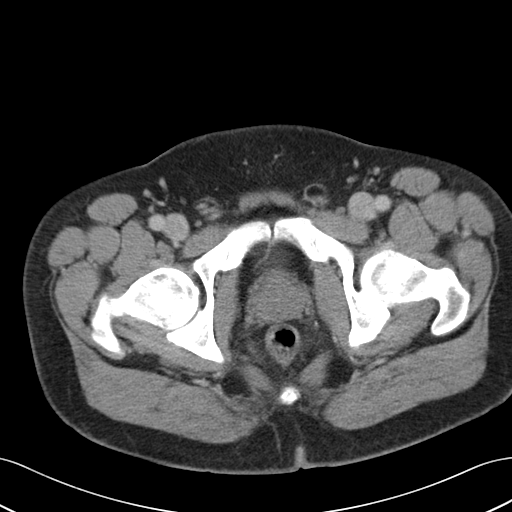
[im 21/95  soft-tissue]
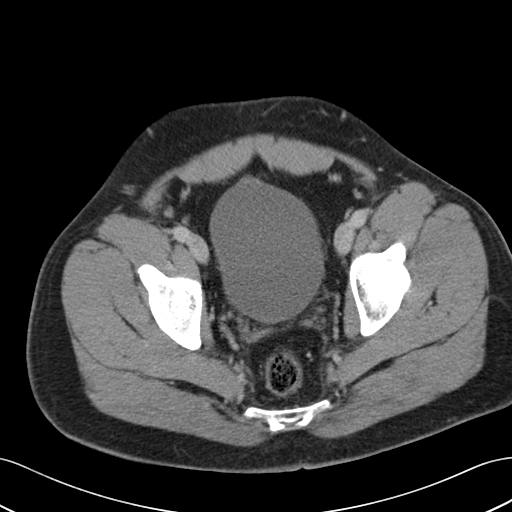
[im 25/95  soft-tissue]
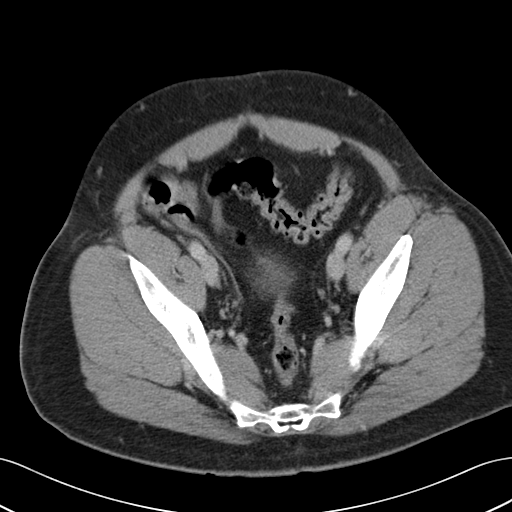
[im 33/95  soft-tissue]
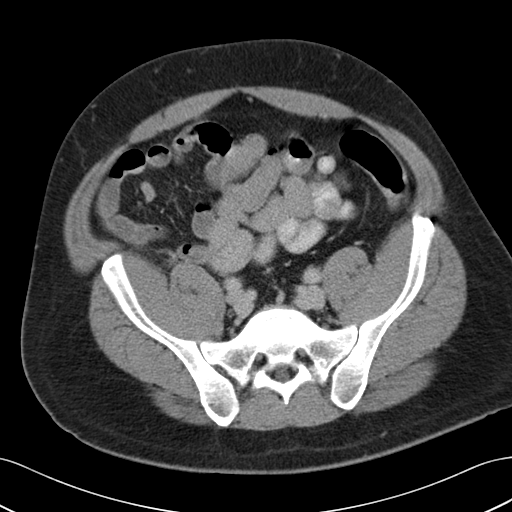
[im 41/95  soft-tissue]
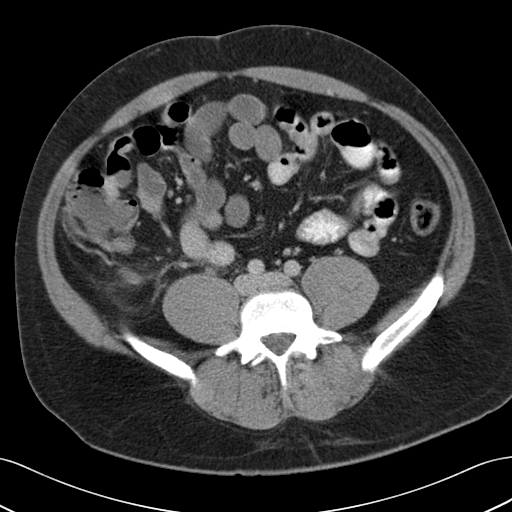
[im 50/95  soft-tissue]
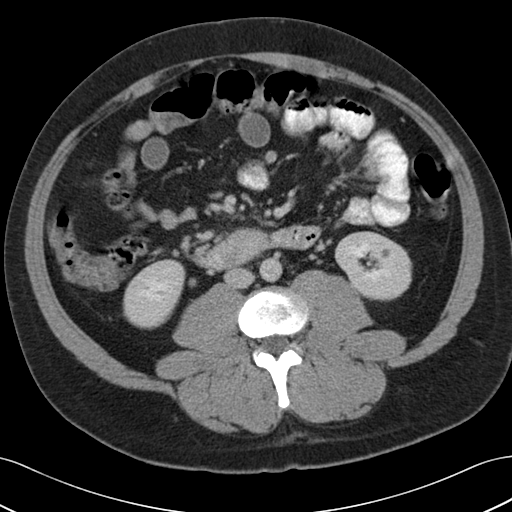
[im 54/95  soft-tissue]
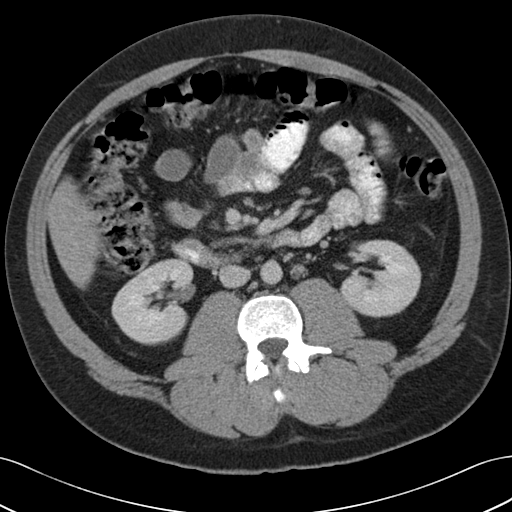
[im 62/95  soft-tissue]
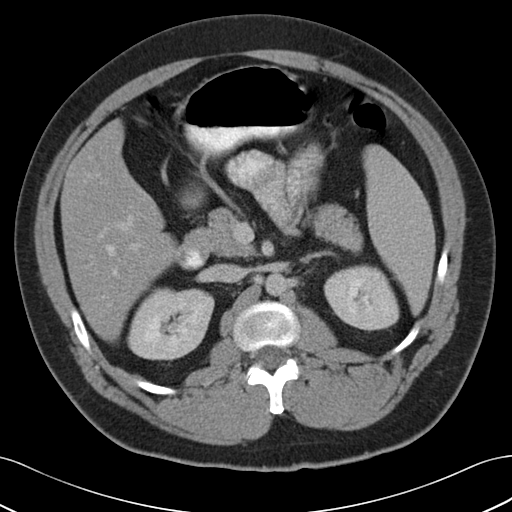
[im 62/95  bone]
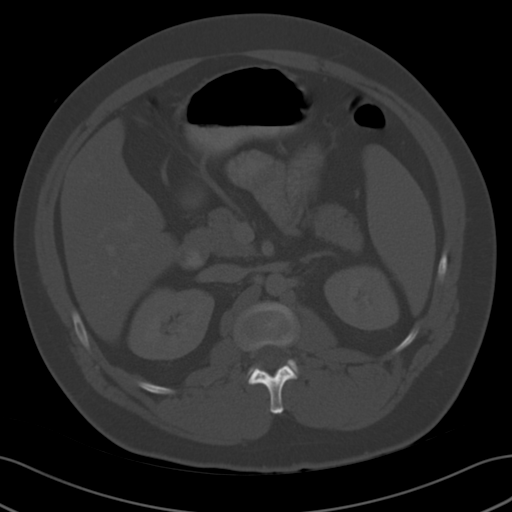
[im 70/95  soft-tissue]
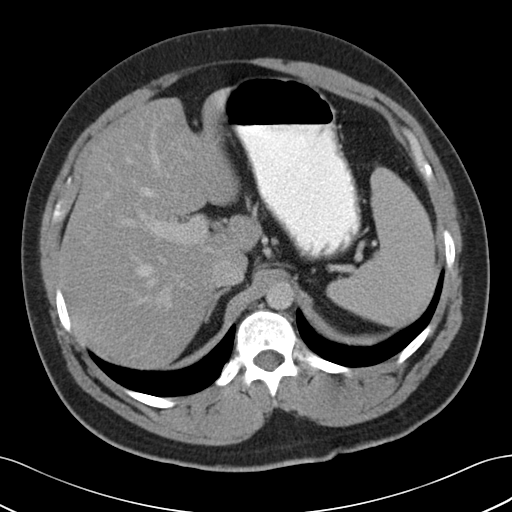
[im 74/95  soft-tissue]
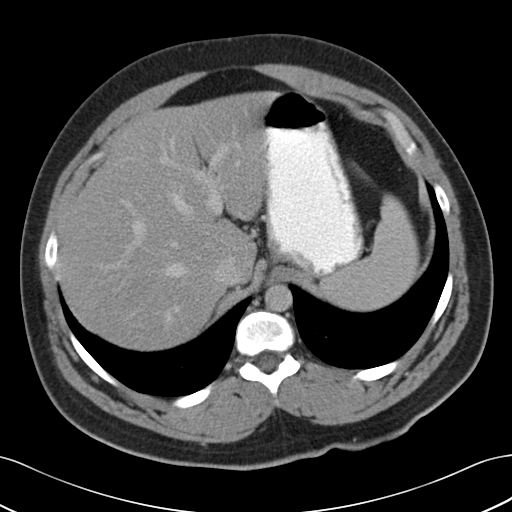
[im 82/95  soft-tissue]
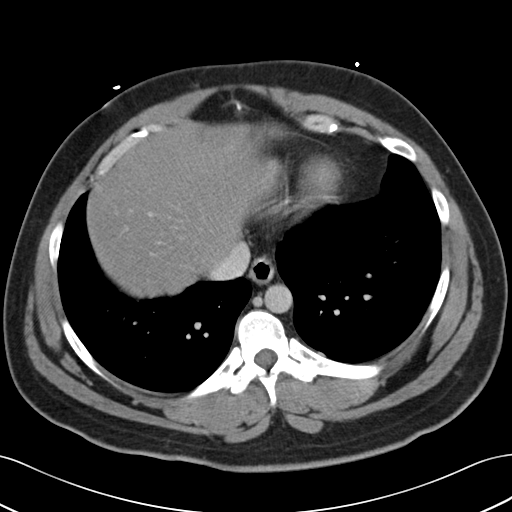
[im 90/95  soft-tissue]
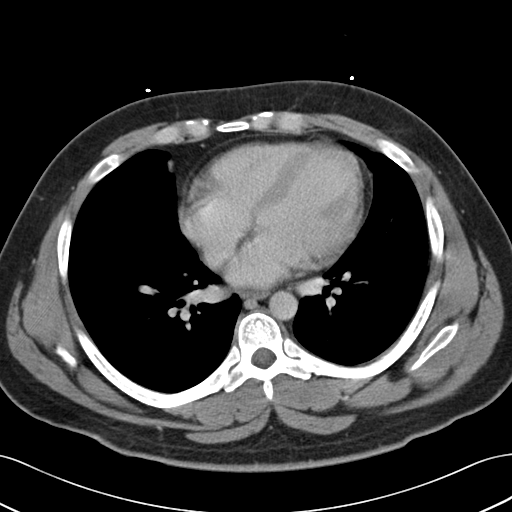

[Series 5: cor routine abd pel with · coronal · 0.71mm/px · 3 of 157 slices shown]
[im 53/157  soft-tissue]
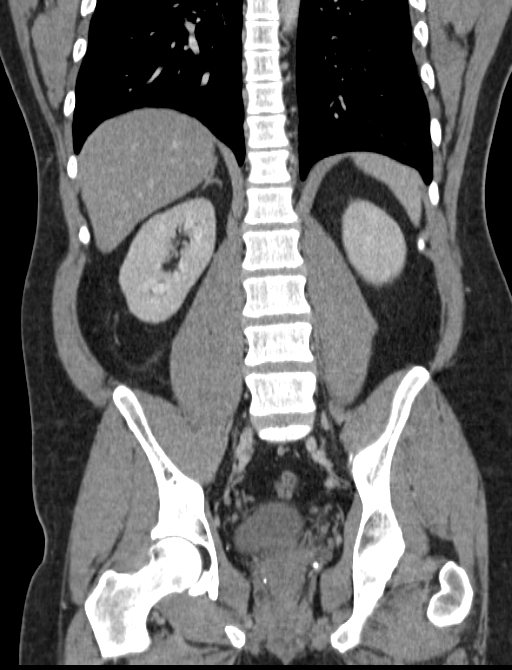
[im 70/157  soft-tissue]
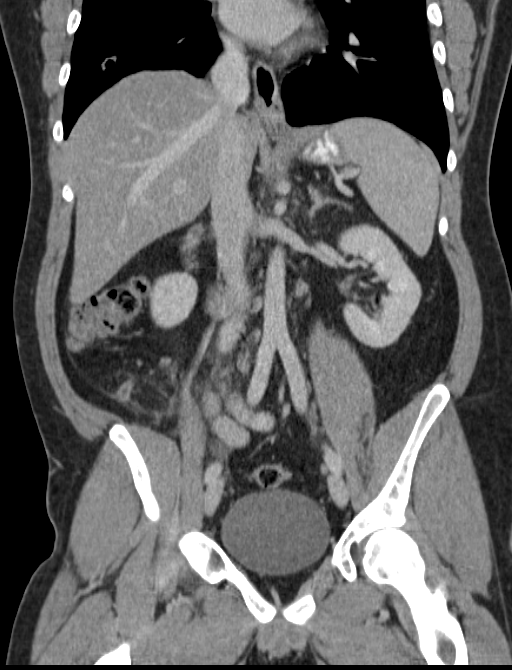
[im 87/157  soft-tissue]
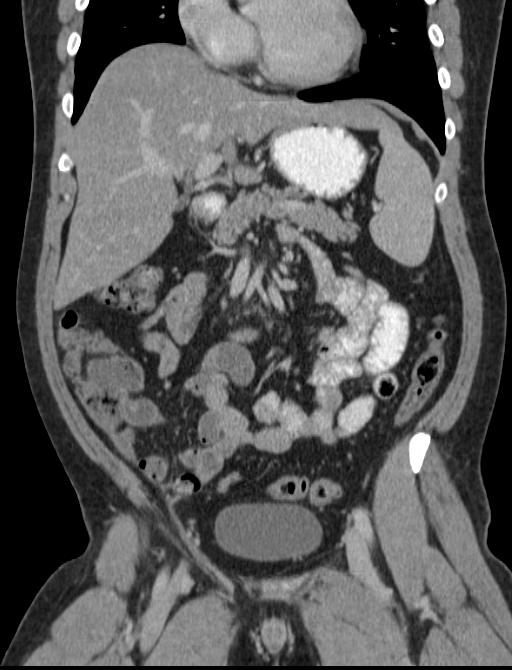

[16 of 46 positions shown; findings below may reference images not displayed]

FINDINGS: Lower Chest: The lung bases are clear. Visualized cardiac structures
are within normal limits for size. No pericardial effusion.
Unremarkable visualized distal thoracic esophagus.

Abdomen: Unremarkable CT appearance of the stomach, duodenum,
spleen, adrenal glands and pancreas. Diffuse mild hypoattenuation of
the hepatic parenchyma consistent with steatosis. There is mild
sparing around the gallbladder fossa. No discrete hepatic lesion.
Gallbladder is unremarkable. No intra or extrahepatic biliary ductal
dilatation. Unremarkable appearance of the bilateral kidneys. No
focal solid lesion, hydronephrosis or nephrolithiasis. 7 mm hypo
attenuating lesion in the lower pole of the left kidney is too small
for accurate characterization but is statistically highly likely a
benign cyst.

Focal hyper enhancement of the appendiceal mucosa. The appendix is
borderline dilated at 8 mm. There is inflammatory stranding in the
periappendiceal fat. No free fluid or free air to suggest
perforation. No focal abscess. No evidence of obstruction.

Pelvis: Unremarkable bladder, prostate gland and seminal vesicles.
No free fluid or suspicious adenopathy.

Bones/Soft Tissues: No acute fracture or aggressive appearing lytic
or blastic osseous lesion.

Vascular: No significant atherosclerotic vascular disease,
aneurysmal dilatation or acute abnormality.
IMPRESSION: 1. CT findings are consistent with acute suppurative appendicitis
without evidence of abscess or perforation.
2. Hepatic steatosis.
These results were called by telephone at the time of interpretation
on 09/15/2014 at [DATE] to Dr. UNO MAT , who verbally
acknowledged these results.

## 2015-09-12 ENCOUNTER — Encounter: Payer: Self-pay | Admitting: Infectious Disease

## 2015-09-13 ENCOUNTER — Other Ambulatory Visit: Payer: Self-pay | Admitting: Internal Medicine

## 2015-09-13 DIAGNOSIS — J020 Streptococcal pharyngitis: Secondary | ICD-10-CM | POA: Insufficient documentation

## 2015-09-13 MED ORDER — AMOXICILLIN-POT CLAVULANATE 875-125 MG PO TABS: 1.0000 | ORAL_TABLET | Freq: Two times a day (BID) | ORAL | Status: DC

## 2015-09-15 ENCOUNTER — Other Ambulatory Visit: Payer: Self-pay | Admitting: *Deleted

## 2015-09-15 DIAGNOSIS — B20 Human immunodeficiency virus [HIV] disease: Secondary | ICD-10-CM

## 2015-09-15 MED ORDER — ELVITEG-COBIC-EMTRICIT-TENOFAF 150-150-200-10 MG PO TABS: 1.0000 | ORAL_TABLET | Freq: Every day | ORAL | Status: DC

## 2015-09-15 NOTE — Telephone Encounter (Signed)
[facility] application. 

## 2015-10-22 ENCOUNTER — Other Ambulatory Visit (INDEPENDENT_AMBULATORY_CARE_PROVIDER_SITE_OTHER): Payer: Self-pay

## 2015-10-22 DIAGNOSIS — Z113 Encounter for screening for infections with a predominantly sexual mode of transmission: Secondary | ICD-10-CM

## 2015-10-22 DIAGNOSIS — B20 Human immunodeficiency virus [HIV] disease: Secondary | ICD-10-CM

## 2015-10-22 LAB — CBC WITH DIFFERENTIAL/PLATELET
BASOS ABS: 0.1 10*3/uL (ref 0.0–0.1)
Basophils Relative: 1 % (ref 0–1)
Eosinophils Absolute: 0.2 10*3/uL (ref 0.0–0.7)
Eosinophils Relative: 3 % (ref 0–5)
HEMATOCRIT: 45.3 % (ref 39.0–52.0)
HEMOGLOBIN: 15.5 g/dL (ref 13.0–17.0)
LYMPHS PCT: 39 % (ref 12–46)
Lymphs Abs: 2 10*3/uL (ref 0.7–4.0)
MCH: 31.1 pg (ref 26.0–34.0)
MCHC: 34.2 g/dL (ref 30.0–36.0)
MCV: 90.8 fL (ref 78.0–100.0)
MONO ABS: 0.5 10*3/uL (ref 0.1–1.0)
MPV: 10.1 fL (ref 8.6–12.4)
Monocytes Relative: 10 % (ref 3–12)
NEUTROS ABS: 2.4 10*3/uL (ref 1.7–7.7)
Neutrophils Relative %: 47 % (ref 43–77)
Platelets: 178 10*3/uL (ref 150–400)
RBC: 4.99 MIL/uL (ref 4.22–5.81)
RDW: 13.8 % (ref 11.5–15.5)
WBC: 5.2 10*3/uL (ref 4.0–10.5)

## 2015-10-22 LAB — COMPLETE METABOLIC PANEL WITH GFR
ALBUMIN: 4.3 g/dL (ref 3.6–5.1)
ALK PHOS: 75 U/L (ref 40–115)
ALT: 84 U/L — AB (ref 9–46)
AST: 48 U/L — AB (ref 10–40)
BILIRUBIN TOTAL: 0.4 mg/dL (ref 0.2–1.2)
BUN: 17 mg/dL (ref 7–25)
CALCIUM: 9.4 mg/dL (ref 8.6–10.3)
CO2: 26 mmol/L (ref 20–31)
CREATININE: 0.83 mg/dL (ref 0.60–1.35)
Chloride: 96 mmol/L — ABNORMAL LOW (ref 98–110)
GFR, Est African American: >89 mL/min (ref >=60)
GFR, Est Non African American: >89 mL/min (ref >=60)
Glucose, Bld: 92 mg/dL (ref 65–99)
Potassium: 4 mmol/L (ref 3.5–5.3)
Sodium: 134 mmol/L — ABNORMAL LOW (ref 135–146)
TOTAL PROTEIN: 7.8 g/dL (ref 6.1–8.1)

## 2015-10-22 LAB — LIPID PANEL
Cholesterol: 288 mg/dL — ABNORMAL HIGH (ref 125–200)
HDL: 36 mg/dL — AB (ref >=40)
LDL CALC: 179 mg/dL — AB (ref <130)
Total CHOL/HDL Ratio: 8 Ratio — ABNORMAL HIGH (ref <=5.0)
Triglycerides: 364 mg/dL — ABNORMAL HIGH (ref <150)
VLDL: 73 mg/dL — ABNORMAL HIGH (ref <30)

## 2015-10-23 LAB — RPR

## 2015-10-23 LAB — URINE CYTOLOGY ANCILLARY ONLY
CHLAMYDIA, DNA PROBE: NEGATIVE
NEISSERIA GONORRHEA: NEGATIVE

## 2015-10-23 LAB — T-HELPER CELL (CD4) - (RCID CLINIC ONLY)
CD4 T CELL HELPER: 24 % — AB (ref 33–55)
CD4 T Cell Abs: 470 /uL (ref 400–2700)

## 2015-10-24 LAB — HIV-1 RNA QUANT-NO REFLEX-BLD

## 2015-11-05 ENCOUNTER — Ambulatory Visit (INDEPENDENT_AMBULATORY_CARE_PROVIDER_SITE_OTHER): Payer: BLUE CROSS/BLUE SHIELD | Admitting: Infectious Disease

## 2015-11-05 ENCOUNTER — Encounter: Payer: Self-pay | Admitting: Infectious Disease

## 2015-11-05 VITALS — BP 149/97 | HR 96 | Temp 98.2°F | Ht 64.0 in | Wt 188.0 lb

## 2015-11-05 DIAGNOSIS — I10 Essential (primary) hypertension: Secondary | ICD-10-CM

## 2015-11-05 DIAGNOSIS — G4700 Insomnia, unspecified: Secondary | ICD-10-CM

## 2015-11-05 DIAGNOSIS — B20 Human immunodeficiency virus [HIV] disease: Secondary | ICD-10-CM

## 2015-11-05 NOTE — Progress Notes (Signed)
Chief complaint: followup for HIV Subjective:    Patient ID: Manuel Russell, male    DOB: [birth year], 30 y.o.   MRN: [id number]  HPI   30 year old diagnosed with HIV in 2012. He was contacted by DIS officer in [place] to come get tested. He had noticed onset of drenching night sweats 2 months earlier. CD4 nadir was 230. He was started on Complera for one year due to his gaining weight taking an extra meal with his Complera. He has comorbid hypertension, insomnia and hyperlipidemia. He has been doing superbly well on Genvoya.  Lab Results  Component Value Date   HIV1RNAQUANT <20 10/22/2015   Lab Results  Component Value Date   CD4TABS 470 10/22/2015   CD4TABS 550 03/13/2015   Past Medical History  Diagnosis Date  . Hypertension   . HIV infection [facility])     Past Surgical History  Procedure Laterality Date  . Appendectomy      Family History  Problem Relation Age of Onset  . Hypertension Father   . Hyperlipidemia Father   . Heart disease Father   . Hyperlipidemia Sister       Social History   Social History  . Marital Status: Single    Spouse Name: N/A  . Number of Children: N/A  . Years of Education: N/A   Occupational History  . 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 Smoking status: Former Smoker    Quit date: 09/02/2012  . Smokeless tobacco: Never Used  . Alcohol Use: 8.4 oz/week    14 Glasses of wine, 0 Standard drinks or equivalent per week  . Drug Use: No  . Sexual Activity: No   Other Topics Concern  . None   Social History Narrative    No Known Allergies   Current outpatient prescriptions:  .  elvitegravir-cobicistat-emtricitabine-tenofovir (GENVOYA) 150-150-200-10 MG TABS tablet, Take 1 tablet by mouth daily with breakfast., Disp: 30 tablet, Rfl: 5 .  losartan-hydrochlorothiazide (HYZAAR) 50-12.5 MG per tablet, Take 1 tablet by mouth daily., Disp: 90 tablet, Rfl: 1 .  metoprolol succinate (TOPROL-XL) 50 MG 24 hr tablet,  Take 1 tablet (50 mg total) by mouth daily. Take with or immediately following a meal., Disp: 90 tablet, Rfl: 3   Review of Systems  Constitutional: Negative for fever, chills, diaphoresis, activity change, appetite change, fatigue and unexpected weight change.  HENT: Negative for congestion, rhinorrhea, sinus pressure, sneezing, sore throat and trouble swallowing.   Eyes: Negative for photophobia and visual disturbance.  Respiratory: Negative for cough, chest tightness, shortness of breath, wheezing and stridor.   Cardiovascular: Negative for chest pain, palpitations and leg swelling.  Gastrointestinal: Negative for nausea, vomiting, abdominal pain, diarrhea, constipation, blood in stool, abdominal distention and anal bleeding.  Genitourinary: Negative for dysuria, hematuria, flank pain and difficulty urinating.  Musculoskeletal: Negative for myalgias, back pain, joint swelling, arthralgias and gait problem.  Skin: Negative for color change, pallor, rash and wound.  Neurological: Negative for dizziness, tremors, weakness and light-headedness.  Hematological: Negative for adenopathy. Does not bruise/bleed easily.  Psychiatric/Behavioral: Negative for behavioral problems, confusion, sleep disturbance, dysphoric mood, decreased concentration and agitation.       Objective:   Physical Exam  Constitutional: He is oriented to person, place, and time. He appears well-developed and well-nourished.  HENT:  Head: Normocephalic and atraumatic.  Eyes: Conjunctivae and EOM are normal.  Neck: Normal range of motion. Neck supple.  Cardiovascular: Normal rate and regular rhythm.   Pulmonary/Chest:  Effort normal. No respiratory distress. He has no wheezes.  Abdominal: Soft. He exhibits no distension.  Musculoskeletal: Normal range of motion.  Neurological: He is alert and oriented to person, place, and time.  Skin: Skin is warm and dry. No rash noted. No erythema. No pallor.  Psychiatric: He has a  normal mood and affect. His behavior is normal. Thought content normal.          Assessment & Plan:   HIV disease: perfect control on  GENVOYA with food for better bone and kidney safety, RTC in July in case he needs to renew ADAP. He lost insurance a few months ago but may be gaining insurance with new part time job  HTN:  BP up today claims to be taking his meds  Insomnia: continue atypical antidepressant  I spent greater than 25 minutes with the patient including greater than 50% of time in face to face counsel of the patient re his HIV, HTN, insomnia and in coordination of his care.

## 2015-11-21 ENCOUNTER — Other Ambulatory Visit: Payer: Self-pay | Admitting: Internal Medicine

## 2015-12-12 ENCOUNTER — Other Ambulatory Visit: Payer: Self-pay | Admitting: Internal Medicine

## 2015-12-12 MED ORDER — METOPROLOL SUCCINATE ER 50 MG PO TB24: 50.0000 mg | ORAL_TABLET | Freq: Every day | ORAL | Status: DC

## 2015-12-12 MED ORDER — LOSARTAN POTASSIUM-HCTZ 100-12.5 MG PO TABS: 1.0000 | ORAL_TABLET | Freq: Every day | ORAL | Status: DC

## 2015-12-12 MED ORDER — LOSARTAN POTASSIUM-HCTZ 50-12.5 MG PO TABS: 1.0000 | ORAL_TABLET | Freq: Every day | ORAL | Status: DC

## 2016-02-19 ENCOUNTER — Other Ambulatory Visit: Payer: Self-pay | Admitting: *Deleted

## 2016-02-19 ENCOUNTER — Encounter: Payer: Self-pay | Admitting: Infectious Disease

## 2016-02-19 DIAGNOSIS — B20 Human immunodeficiency virus [HIV] disease: Secondary | ICD-10-CM

## 2016-02-19 MED ORDER — ELVITEG-COBIC-EMTRICIT-TENOFAF 150-150-200-10 MG PO TABS: 1.0000 | ORAL_TABLET | Freq: Every day | ORAL | Status: DC

## 2016-05-05 ENCOUNTER — Other Ambulatory Visit: Payer: 59

## 2016-05-05 DIAGNOSIS — B20 Human immunodeficiency virus [HIV] disease: Secondary | ICD-10-CM

## 2016-05-05 LAB — CBC WITH DIFFERENTIAL/PLATELET
BASOS PCT: 0 %
Basophils Absolute: 0 cells/uL (ref 0–200)
EOS PCT: 3 %
Eosinophils Absolute: 183 cells/uL (ref 15–500)
HEMATOCRIT: 44 % (ref 38.5–50.0)
Hemoglobin: 15.1 g/dL (ref 13.2–17.1)
LYMPHS PCT: 38 %
Lymphs Abs: 2318 cells/uL (ref 850–3900)
MCH: 31.2 pg (ref 27.0–33.0)
MCHC: 34.3 g/dL (ref 32.0–36.0)
MCV: 90.9 fL (ref 80.0–100.0)
MONOS PCT: 9 %
MPV: 10.1 fL (ref 7.5–12.5)
Monocytes Absolute: 549 cells/uL (ref 200–950)
NEUTROS PCT: 50 %
Neutro Abs: 3050 cells/uL (ref 1500–7800)
PLATELETS: 223 10*3/uL (ref 140–400)
RBC: 4.84 MIL/uL (ref 4.20–5.80)
RDW: 13.5 % (ref 11.0–15.0)
WBC: 6.1 10*3/uL (ref 3.8–10.8)

## 2016-05-05 LAB — LIPID PANEL
CHOL/HDL RATIO: 3.9 ratio (ref <=5.0)
CHOLESTEROL: 224 mg/dL — AB (ref 125–200)
HDL: 58 mg/dL (ref >=40)
LDL Cholesterol: 125 mg/dL (ref <130)
Triglycerides: 206 mg/dL — ABNORMAL HIGH (ref <150)
VLDL: 41 mg/dL — AB (ref <30)

## 2016-05-05 LAB — COMPLETE METABOLIC PANEL WITH GFR
ALT: 31 U/L (ref 9–46)
AST: 18 U/L (ref 10–40)
Albumin: 4.2 g/dL (ref 3.6–5.1)
Alkaline Phosphatase: 69 U/L (ref 40–115)
BUN: 15 mg/dL (ref 7–25)
CALCIUM: 9 mg/dL (ref 8.6–10.3)
CHLORIDE: 102 mmol/L (ref 98–110)
CO2: 25 mmol/L (ref 20–31)
Creat: 0.97 mg/dL (ref 0.60–1.35)
GFR, Est African American: >89 mL/min (ref >=60)
GFR, Est Non African American: >89 mL/min (ref >=60)
Glucose, Bld: 94 mg/dL (ref 65–99)
POTASSIUM: 4.1 mmol/L (ref 3.5–5.3)
Sodium: 137 mmol/L (ref 135–146)
Total Bilirubin: 0.5 mg/dL (ref 0.2–1.2)
Total Protein: 7 g/dL (ref 6.1–8.1)

## 2016-05-06 LAB — T-HELPER CELL (CD4) - (RCID CLINIC ONLY)
CD4 T CELL ABS: 580 /uL (ref 400–2700)
CD4 T CELL HELPER: 24 % — AB (ref 33–55)

## 2016-05-06 LAB — URINE CYTOLOGY ANCILLARY ONLY
CHLAMYDIA, DNA PROBE: NEGATIVE
NEISSERIA GONORRHEA: NEGATIVE

## 2016-05-06 LAB — RPR

## 2016-05-07 LAB — HIV-1 RNA QUANT-NO REFLEX-BLD: HIV-1 RNA Quant, Log: <1.3 Log copies/mL (ref <1.30)

## 2016-05-26 ENCOUNTER — Ambulatory Visit: Payer: 59 | Admitting: Infectious Disease

## 2016-06-30 ENCOUNTER — Encounter: Payer: Self-pay | Admitting: Infectious Disease

## 2016-06-30 ENCOUNTER — Ambulatory Visit (INDEPENDENT_AMBULATORY_CARE_PROVIDER_SITE_OTHER): Payer: 59 | Admitting: Infectious Disease

## 2016-06-30 VITALS — BP 158/119 | HR 101 | Ht 64.0 in | Wt 196.2 lb

## 2016-06-30 DIAGNOSIS — G4700 Insomnia, unspecified: Secondary | ICD-10-CM

## 2016-06-30 DIAGNOSIS — E669 Obesity, unspecified: Secondary | ICD-10-CM

## 2016-06-30 DIAGNOSIS — I10 Essential (primary) hypertension: Secondary | ICD-10-CM | POA: Diagnosis not present

## 2016-06-30 DIAGNOSIS — Z23 Encounter for immunization: Secondary | ICD-10-CM

## 2016-06-30 DIAGNOSIS — B20 Human immunodeficiency virus [HIV] disease: Secondary | ICD-10-CM

## 2016-06-30 NOTE — Progress Notes (Signed)
Chief complaint: followup for HIV Subjective:    Patient ID: Manuel Russell, male    DOB: Jan 15, 1986, 30 y.o.   MRN: [id number]  HPI  30 year old Caucasian man with HIV well controlled on Genvoya with worsening HTN, part of which may be "white coat HTN"   Lab Results  Component Value Date   HIV1RNAQUANT <20 05/05/2016   HIV1RNAQUANT <20 10/22/2015   HIV1RNAQUANT 24 (H) 03/13/2015      Lab Results  Component Value Date   CD4TABS 580 05/05/2016   CD4TABS 470 10/22/2015   CD4TABS 550 03/13/2015   Past Medical History:  Diagnosis Date  . HIV infection (HCC)   . Hypertension     Past Surgical History:  Procedure Laterality Date  . APPENDECTOMY      Family History  Problem Relation Age of Onset  . Hypertension Father   . Hyperlipidemia Father   . Heart disease Father   . Hyperlipidemia Sister       Social History   Social History  . Marital status: Single    Spouse name: N/A  . Number of children: N/A  . 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 Smoking status: Former Smoker    Quit date: 09/02/2012  . Smokeless tobacco: Never Used  . Alcohol use 8.4 oz/week    14 Glasses of wine per week  . Drug use: No  . Sexual activity: No   Other Topics Concern  . Not on file   Social History Narrative  . No narrative on file    No Known Allergies   Current Outpatient Prescriptions:  .  elvitegravir-cobicistat-emtricitabine-tenofovir (GENVOYA) 150-150-200-10 MG TABS tablet, Take 1 tablet by mouth daily with breakfast., Disp: 30 tablet, Rfl: 5 .  losartan-hydrochlorothiazide (HYZAAR) 100-12.5 MG tablet, Take 1 tablet by mouth daily., Disp: 90 tablet, Rfl: 2   Review of Systems  Constitutional: Negative for activity change, appetite change, chills, diaphoresis, fatigue, fever and unexpected weight change.  HENT: Negative for congestion, rhinorrhea, sinus pressure, sneezing, sore throat and  trouble swallowing.   Eyes: Negative for photophobia and visual disturbance.  Respiratory: Negative for cough, chest tightness, shortness of breath, wheezing and stridor.   Cardiovascular: Negative for chest pain, palpitations and leg swelling.  Gastrointestinal: Negative for abdominal distention, abdominal pain, anal bleeding, blood in stool, constipation, diarrhea, nausea and vomiting.  Genitourinary: Negative for difficulty urinating, dysuria, flank pain and hematuria.  Musculoskeletal: Negative for arthralgias, back pain, gait problem, joint swelling and myalgias.  Skin: Negative for color change, pallor, rash and wound.  Neurological: Negative for dizziness, tremors, weakness and light-headedness.  Hematological: Negative for adenopathy. Does not bruise/bleed easily.  Psychiatric/Behavioral: Negative for agitation, behavioral problems, confusion, decreased concentration, dysphoric mood and sleep disturbance.       Objective:   Physical Exam  Constitutional: He is oriented to person, place, and time. He appears well-developed and well-nourished.  HENT:  Head: Normocephalic and atraumatic.  Eyes: Conjunctivae and EOM are normal.  Neck: Normal range of motion. Neck supple.  Cardiovascular: Normal rate and regular rhythm.   Pulmonary/Chest: Effort normal. No respiratory distress. He has no wheezes.  Abdominal: Soft. He exhibits no distension.  Musculoskeletal: Normal range of motion.  Neurological: He is alert and oriented to person, place, and time.  Skin: Skin is warm and dry. No rash noted. No erythema. No pallor.  Psychiatric: He has a normal mood and affect. His  behavior is normal. Thought content normal.          Assessment & Plan:   HIV disease: perfect control on  GENVOYA with food for better bone and kidney safety, Consider change to newer STR when and if  FDA approved and covered by insurance  HTN:  BP up today. I told him I think he needs another drug for this. He  can monitor BP at home if he likes to give more data to Dr Manuel Russell next month  Insomnia: continue atypical antidepressant  I spent greater than 25 minutes with the patient including greater than 50% of time in face to face counsel of the patient re his HIV, HTN, insomnia and in coordination of his care.

## 2016-07-21 ENCOUNTER — Ambulatory Visit (INDEPENDENT_AMBULATORY_CARE_PROVIDER_SITE_OTHER): Payer: 59 | Admitting: Internal Medicine

## 2016-07-21 ENCOUNTER — Encounter: Payer: Self-pay | Admitting: Internal Medicine

## 2016-07-21 VITALS — BP 122/78 | HR 100 | Temp 98.5°F | Ht 64.0 in | Wt 190.0 lb

## 2016-07-21 DIAGNOSIS — I10 Essential (primary) hypertension: Secondary | ICD-10-CM | POA: Diagnosis not present

## 2016-07-21 DIAGNOSIS — Z0000 Encounter for general adult medical examination without abnormal findings: Secondary | ICD-10-CM | POA: Diagnosis not present

## 2016-07-21 DIAGNOSIS — E669 Obesity, unspecified: Secondary | ICD-10-CM | POA: Diagnosis not present

## 2016-07-21 DIAGNOSIS — E559 Vitamin D deficiency, unspecified: Secondary | ICD-10-CM

## 2016-07-21 DIAGNOSIS — B20 Human immunodeficiency virus [HIV] disease: Secondary | ICD-10-CM

## 2016-07-21 LAB — MICROALBUMIN / CREATININE URINE RATIO
Creatinine,U: 142.7 mg/dL
MICROALB/CREAT RATIO: 0.5 mg/g (ref 0.0–30.0)

## 2016-07-21 LAB — VITAMIN D 25 HYDROXY (VIT D DEFICIENCY, FRACTURES): VITD: 18.81 ng/mL — ABNORMAL LOW (ref 30.00–100.00)

## 2016-07-21 LAB — TSH: TSH: 1.6 u[IU]/mL (ref 0.35–4.50)

## 2016-07-21 LAB — PSA: PSA: 0.86 ng/mL (ref 0.10–4.00)

## 2016-07-21 MED ORDER — LISINOPRIL 40 MG PO TABS: 40.0000 mg | ORAL_TABLET | Freq: Every day | ORAL | 3 refills | Status: AC

## 2016-07-21 NOTE — Patient Instructions (Addendum)
We are Changing your BP medication   to lisinopril  40 mg ( no 30 mg available).  Goal BP is 100 to 130/  65 to 80  Checking thyroid PSA and Vit D today (also urine for proteinuria)   Goal weight is 175 lbs

## 2016-07-22 MED FILL — GENVOYA TABLET: 150-150-200 | 30 days supply | Qty: 30 | Fill #0

## 2016-07-24 ENCOUNTER — Encounter: Payer: Self-pay | Admitting: Internal Medicine

## 2016-07-24 DIAGNOSIS — Z0000 Encounter for general adult medical examination without abnormal findings: Secondary | ICD-10-CM | POA: Insufficient documentation

## 2016-07-24 NOTE — Assessment & Plan Note (Signed)

## 2016-07-24 NOTE — Progress Notes (Signed)
Patient ID: Manuel Russell, male    DOB: Oct 29, 1986  Age: 30 y.o. MRN: [id number]  The patient is here for annual  wellness examination and management of other chronic and acute problems.   The risk factors are reflected in the social history.  The roster of all physicians providing medical care to patient - is listed in the Snapshot section of the chart.  Home safety : The patient has smoke detectors in the home. They wear seatbelts.  There are no firearms at home. There is no violence in the home.   There is no risks for hepatitis, STDs or HIV. There is no   history of blood transfusion. They have no travel history to infectious disease endemic areas of the world.  The patient has seen their dentist in the last six month. They have seen their eye doctor in the last year.     Discussed the need for sun protection: hats, long sleeves and use of sunscreen if there is significant sun exposure.   Diet: the importance of a healthy diet is discussed. They do have a healthy diet.  The benefits of regular aerobic exercise were discussed. He does not exercise regularly. .   Depression screen: there are no signs or vegative symptoms of depression- irritability, change in appetite, anhedonia, sadness/tearfullness.  The following portions of the patient's history were reviewed and updated as appropriate: allergies, current medications, past family history, past medical history,  past surgical history, past social history  and problem list.  Visual acuity was not assessed per patient preference since she has regular follow up with her ophthalmologist. Hearing and body mass index were assessed and reviewed.   During the course of the visit the patient was educated and counseled about appropriate screening and preventive services including : fall prevention , diabetes screening, nutrition counseling, colorectal cancer screening, and recommended immunizations.    CC: The primary encounter diagnosis  was Essential hypertension. Diagnoses of Vitamin D deficiency, HIV disease (HCC), Obesity, and Encounter for preventive health examination were also pertinent to this visit.      History Manuel Russell has a past medical history of HIV infection (HCC) and Hypertension.   He has a past surgical history that includes Appendectomy.     follow up on hypertension and obesity.  He was last seen Nov 2015  In this office  but is well known to PCP and has quarterly follow up with  Dr. Daiva Eves for HIV management and has had several elevated BP readings.    Has been out of medications for two weeks but started  taking lisinopril 30 mg daily  for the last 3 days ,  Stopped metoprolol months go due to side effect of increased forgetfulnes.    Obesity:  Has been intentionally losing weight by restricting diet to low glycemic index.  Does not exercise.    His family history includes Heart disease in his father; Hyperlipidemia in his father and sister; Hypertension in his father.He reports that he quit smoking about 3 years ago. He quit after 5.00 years of use. He has never used smokeless tobacco. He reports that he drinks about 8.4 oz of alcohol per week . He reports that he does not use drugs.  Outpatient Medications Prior to Visit  Medication Sig Dispense Refill  . elvitegravir-cobicistat-emtricitabine-tenofovir (GENVOYA) 150-150-200-10 MG TABS tablet Take 1 tablet by mouth daily with breakfast. 30 tablet 5  . losartan-hydrochlorothiazide (HYZAAR) 100-12.5 MG tablet Take 1 tablet by mouth daily. 90 tablet  2   No facility-administered medications prior to visit.     Review of Systems   Patient denies headache, fevers, malaise, unintentional weight loss, skin rash, eye pain, sinus congestion and sinus pain, sore throat, dysphagia,  hemoptysis , cough, dyspnea, wheezing, chest pain, palpitations, orthopnea, edema, abdominal pain, nausea, melena, diarrhea, constipation, flank pain, dysuria, hematuria, urinary   Frequency, nocturia, numbness, tingling, seizures,  Focal weakness, Loss of consciousness,  Tremor, insomnia, depression, anxiety, and suicidal ideation.      Objective:  BP 122/78   Pulse 100   Temp 98.5 F (36.9 C) (Oral)   Ht 5\' 4"  (1.626 m)   Wt 190 lb (86.2 kg)   SpO2 96%   BMI 32.61 kg/m   Physical Exam  General appearance: alert, cooperative and appears stated age Ears: normal TM's and external ear canals both ears Throat: lips, mucosa, and tongue normal; teeth and gums normal Neck: no adenopathy, no carotid bruit, supple, symmetrical, trachea midline and thyroid not enlarged, symmetric, no tenderness/mass/nodules Back: symmetric, no curvature. ROM normal. No CVA tenderness. Lungs: clear to auscultation bilaterally Heart: regular rate and rhythm, S1, S2 normal, no murmur, click, rub or gallop Abdomen: soft, non-tender; bowel sounds normal; no masses,  no organomegaly Pulses: 2+ and symmetric Skin: Skin color, texture, turgor normal. No rashes or lesions Lymph nodes: Cervical, supraclavicular, and axillary nodes normal.   Assessment & Plan:   Problem List Items Addressed This Visit    Essential hypertension - Primary    Did not tolerate metoprolol due ot cognitive changes.  Continue lisinopril .  Lab Results  Component Value Date   CREATININE 0.97 05/05/2016         Relevant Medications   lisinopril (PRINIVIL,ZESTRIL) 40 MG tablet   Other Relevant Orders   Microalbumin / creatinine urine ratio (Completed)   Obesity    I have addressed  BMI and recommended wt loss of 10% of body weight over the next 6 months a=with continued adherence to a low glycemic index diet and participation in regular exercise a minimum of 5 days per week.        Relevant Orders   TSH (Completed)   Encounter for preventive health examination    Annual comprehensive preventive exam was done as well as an evaluation and management of acute and chronic conditions .  During the course of  the visit the patient was educated and counseled about appropriate screening and preventive services including :  diabetes screening, lipid analysis with projected  10 year  risk for CAD , nutrition counseling, prostate and colorectal cancer screening, and recommended immunizations.  Printed recommendations for health maintenance screenings was given.       HIV disease (HCC)   Relevant Orders   PSA (Completed)    Other Visit Diagnoses    Vitamin D deficiency       Relevant Orders   VITAMIN D 25 Hydroxy (Vit-D Deficiency, Fractures) (Completed)      I have discontinued Manuel Russell's losartan-hydrochlorothiazide. I am also having him start on lisinopril. Additionally, I am having him maintain his elvitegravir-cobicistat-emtricitabine-tenofovir.  Meds ordered this encounter  Medications  . lisinopril (PRINIVIL,ZESTRIL) 40 MG tablet    Sig: Take 1 tablet (40 mg total) by mouth daily.    Dispense:  90 tablet    Refill:  3    Medications Discontinued During This Encounter  Medication Reason  . losartan-hydrochlorothiazide (HYZAAR) 100-12.5 MG tablet     Follow-up: No Follow-up on file.   Yuleimy Kretz,  Aris Everts, MD

## 2016-07-24 NOTE — Assessment & Plan Note (Signed)
I have addressed  BMI and recommended wt loss of 10% of body weight over the next 6 months a=with continued adherence to a low glycemic index diet and participation in regular exercise a minimum of 5 days per week.

## 2016-07-24 NOTE — Assessment & Plan Note (Signed)
Did not tolerate metoprolol due ot cognitive changes.  Continue lisinopril .  Lab Results  Component Value Date   CREATININE 0.97 05/05/2016

## 2016-08-31 MED FILL — GENVOYA TABLET: 150-150-200 | 30 days supply | Qty: 30 | Fill #1

## 2016-09-13 ENCOUNTER — Other Ambulatory Visit: Payer: Self-pay | Admitting: Internal Medicine

## 2016-09-13 MED ORDER — ESCITALOPRAM OXALATE 10 MG PO TABS: 10.0000 mg | ORAL_TABLET | Freq: Every day | ORAL | 5 refills | Status: AC

## 2016-09-29 MED FILL — GENVOYA TABLET: 150-150-200 | 30 days supply | Qty: 30 | Fill #2

## 2016-10-12 ENCOUNTER — Other Ambulatory Visit: Payer: Self-pay | Admitting: Internal Medicine

## 2016-10-12 MED ORDER — PROMETHAZINE HCL 12.5 MG PO TABS: 12.5000 mg | ORAL_TABLET | Freq: Three times a day (TID) | ORAL | 0 refills | Status: AC | PRN

## 2016-10-12 MED ORDER — OSELTAMIVIR PHOSPHATE 75 MG PO CAPS: 75.0000 mg | ORAL_CAPSULE | Freq: Two times a day (BID) | ORAL | 0 refills | Status: AC

## 2016-11-05 MED FILL — GENVOYA TABLET: 150-150-200 | 30 days supply | Qty: 30 | Fill #3 | Status: TO

## 2016-11-10 ENCOUNTER — Encounter: Payer: Self-pay | Admitting: Physician Assistant

## 2016-11-10 ENCOUNTER — Ambulatory Visit: Payer: Self-pay | Admitting: Physician Assistant

## 2016-11-10 VITALS — BP 130/84 | HR 130 | Temp 103.1°F

## 2016-11-10 DIAGNOSIS — J101 Influenza due to other identified influenza virus with other respiratory manifestations: Secondary | ICD-10-CM

## 2016-11-10 LAB — POCT INFLUENZA A/B
INFLUENZA A, POC: POSITIVE — AB
INFLUENZA B, POC: NEGATIVE

## 2016-11-10 NOTE — Progress Notes (Signed)
S: C/o runny nose and congestion with dry cough for 1 days, + fever, chills, denies cp/sob, v/d; , cough is sporadic, dry; has rx of tamiflu at home that his doctor called in but didn't take back in December bc he started feeling better  Using otc meds: robitussin  O: PE: vitals w elevated temp and hr, nad,  perrl eomi, normocephalic, tms dull, nasal mucosa red and swollen, throat injected, neck supple no lymph, lungs c t a, cv rrr, neuro intact, flu swab +A  A:  Acute influenza A   P: drink fluids, continue regular meds , use otc meds of choice, return if not improving in 5 days, return earlier if worsening , take tamiflu 75mg  bid

## 2016-12-02 MED FILL — GENVOYA TABLET: 150-150-200 | 30 days supply | Qty: 30 | Fill #0

## 2016-12-16 ENCOUNTER — Other Ambulatory Visit: Payer: 59

## 2016-12-16 DIAGNOSIS — B20 Human immunodeficiency virus [HIV] disease: Secondary | ICD-10-CM

## 2016-12-16 LAB — LIPID PANEL
Cholesterol: 269 mg/dL — ABNORMAL HIGH (ref <200)
HDL: 45 mg/dL (ref >40)
Total CHOL/HDL Ratio: 6 Ratio — ABNORMAL HIGH (ref <5.0)
Triglycerides: 486 mg/dL — ABNORMAL HIGH (ref <150)

## 2016-12-16 LAB — CBC WITH DIFFERENTIAL/PLATELET
BASOS ABS: 76 {cells}/uL (ref 0–200)
Basophils Relative: 1 %
EOS ABS: 152 {cells}/uL (ref 15–500)
EOS PCT: 2 %
HCT: 43.1 % (ref 38.5–50.0)
HEMOGLOBIN: 14.9 g/dL (ref 13.2–17.1)
LYMPHS ABS: 2508 {cells}/uL (ref 850–3900)
Lymphocytes Relative: 33 %
MCH: 31.6 pg (ref 27.0–33.0)
MCHC: 34.6 g/dL (ref 32.0–36.0)
MCV: 91.3 fL (ref 80.0–100.0)
MONO ABS: 608 {cells}/uL (ref 200–950)
MPV: 9.4 fL (ref 7.5–12.5)
Monocytes Relative: 8 %
NEUTROS ABS: 4256 {cells}/uL (ref 1500–7800)
Neutrophils Relative %: 56 %
Platelets: 214 10*3/uL (ref 140–400)
RBC: 4.72 MIL/uL (ref 4.20–5.80)
RDW: 13.8 % (ref 11.0–15.0)
WBC: 7.6 10*3/uL (ref 3.8–10.8)

## 2016-12-16 LAB — COMPLETE METABOLIC PANEL WITH GFR
ALBUMIN: 4 g/dL (ref 3.6–5.1)
ALK PHOS: 70 U/L (ref 40–115)
ALT: 46 U/L (ref 9–46)
AST: 31 U/L (ref 10–40)
BILIRUBIN TOTAL: 0.6 mg/dL (ref 0.2–1.2)
BUN: 17 mg/dL (ref 7–25)
CO2: 23 mmol/L (ref 20–31)
Calcium: 8.9 mg/dL (ref 8.6–10.3)
Chloride: 99 mmol/L (ref 98–110)
Creat: 1.32 mg/dL (ref 0.60–1.35)
GFR, EST NON AFRICAN AMERICAN: 70 mL/min (ref >=60)
GFR, Est African American: 81 mL/min (ref >=60)
GLUCOSE: 81 mg/dL (ref 65–99)
Potassium: 4 mmol/L (ref 3.5–5.3)
Sodium: 135 mmol/L (ref 135–146)
TOTAL PROTEIN: 7.3 g/dL (ref 6.1–8.1)

## 2016-12-17 LAB — T-HELPER CELL (CD4) - (RCID CLINIC ONLY)
CD4 T CELL HELPER: 27 % — AB (ref 33–55)
CD4 T Cell Abs: 680 /uL (ref 400–2700)

## 2016-12-20 ENCOUNTER — Other Ambulatory Visit: Payer: 59

## 2016-12-23 LAB — HIV-1 RNA QUANT-NO REFLEX-BLD
HIV 1 RNA Quant: <20 copies/mL — AB
HIV-1 RNA QUANT, LOG: DETECTED {Log_copies}/mL — AB

## 2016-12-30 ENCOUNTER — Other Ambulatory Visit: Payer: Self-pay | Admitting: Infectious Disease

## 2016-12-30 DIAGNOSIS — B20 Human immunodeficiency virus [HIV] disease: Secondary | ICD-10-CM

## 2016-12-30 MED FILL — GENVOYA TABLET: 150-150-200 | 30 days supply | Qty: 30 | Fill #0

## 2017-01-03 ENCOUNTER — Ambulatory Visit: Payer: 59 | Admitting: Infectious Disease

## 2017-01-12 ENCOUNTER — Ambulatory Visit: Payer: 59 | Admitting: Infectious Disease

## 2017-01-28 MED FILL — GENVOYA TABLET: 150-150-200 | 30 days supply | Qty: 30 | Fill #1

## 2017-03-01 MED FILL — GENVOYA TABLET: 150-150-200 | 30 days supply | Qty: 30 | Fill #2

## 2017-03-31 MED FILL — GENVOYA TABLET: 150-150-200 | 30 days supply | Qty: 30 | Fill #3

## 2017-05-05 MED FILL — GENVOYA TABLET: 150-150-200 | 30 days supply | Qty: 30 | Fill #4

## 2017-06-09 MED FILL — GENVOYA TABLET: 150-150-200 | 30 days supply | Qty: 30 | Fill #5
